# Patient Record
Sex: Male | Born: 1960 | Race: Black or African American | Hispanic: No | Marital: Married | Smoking: Never smoker
Health system: Southern US, Community
[De-identification: ages and names within clinical notes are randomized; demographics above are authoritative.]

## PROBLEM LIST (undated history)

## (undated) DIAGNOSIS — N401 Enlarged prostate with lower urinary tract symptoms: Secondary | ICD-10-CM

## (undated) DIAGNOSIS — N201 Calculus of ureter: Secondary | ICD-10-CM

---

## 2019-12-03 DIAGNOSIS — Z8616 Personal history of COVID-19: Secondary | ICD-10-CM

## 2019-12-03 HISTORY — DX: Personal history of COVID-19: Z86.16

## 2020-03-23 DIAGNOSIS — Z8616 Personal history of COVID-19: Secondary | ICD-10-CM

## 2020-04-04 DIAGNOSIS — R7303 Prediabetes: Secondary | ICD-10-CM

## 2020-04-04 HISTORY — DX: Prediabetes: R73.03

## 2020-04-30 ENCOUNTER — Encounter (HOSPITAL_COMMUNITY): Payer: Self-pay

## 2020-04-30 ENCOUNTER — Ambulatory Visit (HOSPITAL_COMMUNITY)
Admission: EM | Admit: 2020-04-30 | Discharge: 2020-04-30 | Disposition: A | Discharge status: Home / Self Care | Payer: 59 | Source: Home / Self Care

## 2020-04-30 ENCOUNTER — Other Ambulatory Visit: Payer: Self-pay

## 2020-04-30 DIAGNOSIS — R339 Retention of urine, unspecified: Secondary | ICD-10-CM

## 2020-04-30 DIAGNOSIS — N3001 Acute cystitis with hematuria: Secondary | ICD-10-CM

## 2020-04-30 DIAGNOSIS — N200 Calculus of kidney: Secondary | ICD-10-CM | POA: Diagnosis not present

## 2020-04-30 DIAGNOSIS — A4151 Sepsis due to Escherichia coli [E. coli]: Secondary | ICD-10-CM | POA: Diagnosis not present

## 2020-04-30 LAB — POCT URINALYSIS DIPSTICK, ED / UC
Bilirubin Urine: NEGATIVE
Bilirubin Urine: NEGATIVE
Glucose, UA: 100 mg/dL — AB
Glucose, UA: NEGATIVE mg/dL
Ketones, ur: NEGATIVE mg/dL
Ketones, ur: NEGATIVE mg/dL
Nitrite: NEGATIVE
Nitrite: NEGATIVE
Protein, ur: 100 mg/dL — AB
Protein, ur: 100 mg/dL — AB
Specific Gravity, Urine: 1.025 (ref 1.005–1.030)
Specific Gravity, Urine: 1.025 (ref 1.005–1.030)
Urobilinogen, UA: 0.2 mg/dL (ref 0.0–1.0)
Urobilinogen, UA: 0.2 mg/dL (ref 0.0–1.0)
pH: 5.5 (ref 5.0–8.0)
pH: 5.5 (ref 5.0–8.0)

## 2020-04-30 MED ORDER — SULFAMETHOXAZOLE-TRIMETHOPRIM 800-160 MG PO TABS: 1.0000 | ORAL_TABLET | Freq: Two times a day (BID) | ORAL | 0 refills | Status: DC

## 2020-04-30 NOTE — ED Provider Notes (Signed)
[facility]    CSN: [id number] Arrival date & time: 04/30/20  1207      History   Chief Complaint Chief Complaint  Patient presents with  . Urinary Retention    HPI Brandon Carpenter is a 60 y.o. male presenting with urinary frequency for 1-2 months.  States he has had suprapubic pressure  for 2 months, relieved with urination.  Also endorses frequency, urgency;.  Lower back pain and chills for 2 days.  States he is waking up during the night to urinate every 3 hours. Denies hematuria, dysuria,  N/v/d, abdnormal discharge.    HPI  History reviewed. No pertinent past medical history.  There are no problems to display for this patient.   History reviewed. No pertinent surgical history.     Home Medications    Prior to Admission medications   Medication Sig Start Date End Date Taking? Authorizing Provider  Misc Natural Products (PROSTATE HEALTH PO) Take by mouth.   Yes [provider]  sulfamethoxazole-trimethoprim (BACTRIM DS) 800-160 MG tablet Take 1 tablet by mouth 2 (two) times daily for 7 days. 04/30/20 05/07/20 Yes Hazel Sams, PA-C    Family History No family history on file.  Social History Social History   Tobacco Use  . Smoking status: Never Smoker  . Smokeless tobacco: Never Used  Substance Use Topics  . Alcohol use: Yes  . Drug use: Never     Allergies   Penicillins   Review of Systems Review of Systems  Constitutional: Negative for appetite change, chills, diaphoresis and fever.  Respiratory: Negative for shortness of breath.   Cardiovascular: Negative for chest pain.  Gastrointestinal: Positive for abdominal pain. Negative for blood in stool, constipation, diarrhea, nausea and vomiting.  Genitourinary: Positive for flank pain, frequency and urgency. Negative for decreased urine volume, difficulty urinating, dysuria, genital sores, hematuria, penile discharge, penile pain, penile swelling, scrotal swelling and testicular  pain.  Musculoskeletal: Negative for back pain.  Neurological: Negative for dizziness, weakness and light-headedness.  All other systems reviewed and are negative.    Physical Exam Triage Vital Signs ED Triage Vitals  Enc Vitals Group     BP 04/30/20 1308 (!) 144/77     Pulse Rate 04/30/20 1308 100     Resp 04/30/20 1308 18     Temp 04/30/20 1308 97.7 F (36.5 C)     Temp Source 04/30/20 1308 Oral     SpO2 04/30/20 1308 98 %     Weight --      Height --      Head Circumference --      Peak Flow --      Pain Score 04/30/20 1305 6     Pain Loc --      Pain Edu? --      Excl. in [place]? --    No data found.  Updated Vital Signs BP (!) 144/77 (BP Location: Left Arm)   Pulse 100   Temp 97.7 F (36.5 C) (Oral)   Resp 18   SpO2 98%   Visual Acuity Right Eye Distance:   Left Eye Distance:   Bilateral Distance:    Right Eye Near:   Left Eye Near:    Bilateral Near:     Physical Exam Vitals reviewed.  Constitutional:      General: He is not in acute distress.    Appearance: Normal appearance. He is not ill-appearing.  HENT:     Head: Normocephalic and atraumatic.  Cardiovascular:  Rate and Rhythm: Normal rate and regular rhythm.     Heart sounds: Normal heart sounds.  Pulmonary:     Effort: Pulmonary effort is normal.     Breath sounds: Normal breath sounds. No wheezing, rhonchi or rales.  Abdominal:     General: Bowel sounds are normal. There is no distension.     Palpations: Abdomen is soft. There is no mass.     Tenderness: There is abdominal tenderness in the suprapubic area. There is no right CVA tenderness, left CVA tenderness, guarding or rebound. Negative signs include Murphy's sign, Rovsing's sign and McBurney's sign.  Neurological:     General: No focal deficit present.     Mental Status: He is alert and oriented to person, place, and time.  Psychiatric:        Mood and Affect: Mood normal.        Behavior: Behavior normal.        Thought Content:  Thought content normal.        Judgment: Judgment normal.      UC Treatments / Results  Labs (all labs ordered are listed, but only abnormal results are displayed) Labs Reviewed  POCT URINALYSIS DIPSTICK, ED / UC - Abnormal; Notable for the following components:      Result Value   Glucose, UA 100 (*)    Hgb urine dipstick MODERATE (*)    Protein, ur 100 (*)    Leukocytes,Ua SMALL (*)    All other components within normal limits  POCT URINALYSIS DIPSTICK, ED / UC - Abnormal; Notable for the following components:   Hgb urine dipstick MODERATE (*)    Protein, ur 100 (*)    Leukocytes,Ua SMALL (*)    All other components within normal limits  URINE CULTURE    EKG   Radiology No results found.  Procedures Procedures (including critical care time)  Medications Ordered in UC Medications - No data to display  Initial Impression / Assessment and Plan / UC Course  I have reviewed the triage vital signs and the nursing notes.  Pertinent labs & imaging results that were available during my care of the patient were reviewed by me and considered in my medical decision making (see chart for details).    This patient is a 60 year old male presenting with acute cystitis with hematuria.  UA with moderate blood, small leuk. Culture sent.  Pen allergic.  Will treat for UTI with Bactrim as below. He does not have a PCP so referral placed.  Discussed if he continues to have symptoms despite treatment, his prostate could also be playing a role.  Rec follow-up with PCP for this. Return precautions discussed.  Spent over 30 minutes obtaining H&P, performing physical, discussing results, treatment plan and plan for follow-up with patient. Patient agrees with plan.   Final Clinical Impressions(s) / UC Diagnoses   Final diagnoses:  Acute cystitis with hematuria     Discharge Instructions     -Start the antibiotic- bactrim, twice daily for 7 days.  -Drink plenty of water.   -Follow-up if your symptoms worsen or persist, like worsening of back pain, fevers/chills, abdominal pain   ED Prescriptions    Medication Sig Dispense Auth. Provider   sulfamethoxazole-trimethoprim (BACTRIM DS) 800-160 MG tablet Take 1 tablet by mouth 2 (two) times daily for 7 days. 14 tablet Hazel Sams, PA-C     PDMP not reviewed this encounter.   Hazel Sams, PA-C 04/30/20 1352

## 2020-04-30 NOTE — Discharge Instructions (Addendum)
-  Start the antibiotic- bactrim, twice daily for 7 days.  -Drink plenty of water.  -Follow-up if your symptoms worsen or persist, like worsening of back pain, fevers/chills, abdominal pain

## 2020-04-30 NOTE — ED Triage Notes (Signed)
Pt reports lower abdominal pain when he needs to urinates x 2 month. States having difficult when trying urinating. Pt is concern as he needs to wake up to urinate every 3 hrs.  Reports chills and lower back pain x 2 days.

## 2020-05-01 ENCOUNTER — Other Ambulatory Visit: Payer: Self-pay

## 2020-05-01 ENCOUNTER — Inpatient Hospital Stay (HOSPITAL_COMMUNITY): Payer: 59 | Admitting: Certified Registered"

## 2020-05-01 ENCOUNTER — Encounter (HOSPITAL_COMMUNITY): Payer: Self-pay

## 2020-05-01 ENCOUNTER — Emergency Department (HOSPITAL_COMMUNITY): Payer: 59

## 2020-05-01 ENCOUNTER — Inpatient Hospital Stay (HOSPITAL_COMMUNITY): Payer: 59

## 2020-05-01 ENCOUNTER — Encounter (HOSPITAL_COMMUNITY)
Admission: EM | Disposition: A | Discharge status: Home / Self Care | Payer: Self-pay | Source: Home / Self Care | Attending: Family Medicine

## 2020-05-01 ENCOUNTER — Inpatient Hospital Stay (HOSPITAL_COMMUNITY)
Admission: EM | Admit: 2020-05-01 | Discharge: 2020-05-03 | DRG: 854 | Disposition: A | Discharge status: Home / Self Care | Payer: 59 | Attending: Family Medicine | Admitting: Family Medicine

## 2020-05-01 DIAGNOSIS — A4151 Sepsis due to Escherichia coli [E. coli]: Secondary | ICD-10-CM | POA: Diagnosis present

## 2020-05-01 DIAGNOSIS — R339 Retention of urine, unspecified: Secondary | ICD-10-CM | POA: Diagnosis present

## 2020-05-01 DIAGNOSIS — Z20822 Contact with and (suspected) exposure to covid-19: Secondary | ICD-10-CM | POA: Diagnosis present

## 2020-05-01 DIAGNOSIS — N136 Pyonephrosis: Secondary | ICD-10-CM | POA: Diagnosis present

## 2020-05-01 DIAGNOSIS — I248 Other forms of acute ischemic heart disease: Secondary | ICD-10-CM | POA: Diagnosis present

## 2020-05-01 DIAGNOSIS — E871 Hypo-osmolality and hyponatremia: Secondary | ICD-10-CM | POA: Diagnosis present

## 2020-05-01 DIAGNOSIS — R0682 Tachypnea, not elsewhere classified: Secondary | ICD-10-CM | POA: Diagnosis not present

## 2020-05-01 DIAGNOSIS — D6959 Other secondary thrombocytopenia: Secondary | ICD-10-CM | POA: Diagnosis present

## 2020-05-01 DIAGNOSIS — H6123 Impacted cerumen, bilateral: Secondary | ICD-10-CM | POA: Diagnosis present

## 2020-05-01 DIAGNOSIS — N138 Other obstructive and reflux uropathy: Secondary | ICD-10-CM | POA: Diagnosis present

## 2020-05-01 DIAGNOSIS — N401 Enlarged prostate with lower urinary tract symptoms: Secondary | ICD-10-CM | POA: Diagnosis present

## 2020-05-01 DIAGNOSIS — Z9889 Other specified postprocedural states: Secondary | ICD-10-CM

## 2020-05-01 DIAGNOSIS — N179 Acute kidney failure, unspecified: Secondary | ICD-10-CM | POA: Diagnosis present

## 2020-05-01 DIAGNOSIS — E8809 Other disorders of plasma-protein metabolism, not elsewhere classified: Secondary | ICD-10-CM | POA: Diagnosis present

## 2020-05-01 DIAGNOSIS — Z8616 Personal history of COVID-19: Secondary | ICD-10-CM | POA: Diagnosis not present

## 2020-05-01 DIAGNOSIS — A419 Sepsis, unspecified organism: Secondary | ICD-10-CM

## 2020-05-01 DIAGNOSIS — R6520 Severe sepsis without septic shock: Secondary | ICD-10-CM | POA: Diagnosis present

## 2020-05-01 DIAGNOSIS — R7303 Prediabetes: Secondary | ICD-10-CM | POA: Diagnosis present

## 2020-05-01 DIAGNOSIS — R338 Other retention of urine: Secondary | ICD-10-CM | POA: Diagnosis present

## 2020-05-01 DIAGNOSIS — R739 Hyperglycemia, unspecified: Secondary | ICD-10-CM | POA: Diagnosis present

## 2020-05-01 DIAGNOSIS — N200 Calculus of kidney: Secondary | ICD-10-CM | POA: Diagnosis present

## 2020-05-01 DIAGNOSIS — I361 Nonrheumatic tricuspid (valve) insufficiency: Secondary | ICD-10-CM | POA: Diagnosis not present

## 2020-05-01 DIAGNOSIS — N390 Urinary tract infection, site not specified: Secondary | ICD-10-CM

## 2020-05-01 DIAGNOSIS — R7989 Other specified abnormal findings of blood chemistry: Secondary | ICD-10-CM | POA: Diagnosis not present

## 2020-05-01 DIAGNOSIS — Z8619 Personal history of other infectious and parasitic diseases: Secondary | ICD-10-CM

## 2020-05-01 DIAGNOSIS — N132 Hydronephrosis with renal and ureteral calculous obstruction: Secondary | ICD-10-CM | POA: Insufficient documentation

## 2020-05-01 HISTORY — PX: CYSTOSCOPY WITH URETEROSCOPY AND STENT PLACEMENT: SHX6377

## 2020-05-01 HISTORY — DX: Personal history of other infectious and parasitic diseases: Z86.19

## 2020-05-01 HISTORY — DX: Urinary tract infection, site not specified: N39.0

## 2020-05-01 LAB — COMPREHENSIVE METABOLIC PANEL
ALT: 18 U/L (ref 0–44)
AST: 57 U/L — ABNORMAL HIGH (ref 15–41)
Albumin: 3.1 g/dL — ABNORMAL LOW (ref 3.5–5.0)
Alkaline Phosphatase: 79 U/L (ref 38–126)
Anion gap: 11 (ref 5–15)
BUN: 16 mg/dL (ref 6–20)
CO2: 20 mmol/L — ABNORMAL LOW (ref 22–32)
Calcium: 8.2 mg/dL — ABNORMAL LOW (ref 8.9–10.3)
Chloride: 101 mmol/L (ref 98–111)
Creatinine, Ser: 1.42 mg/dL — ABNORMAL HIGH (ref 0.61–1.24)
GFR, Estimated: 58 mL/min — ABNORMAL LOW (ref >60)
Glucose, Bld: 198 mg/dL — ABNORMAL HIGH (ref 70–99)
Potassium: 3.8 mmol/L (ref 3.5–5.1)
Sodium: 132 mmol/L — ABNORMAL LOW (ref 135–145)
Total Bilirubin: 1.3 mg/dL — ABNORMAL HIGH (ref 0.3–1.2)
Total Protein: 6.3 g/dL — ABNORMAL LOW (ref 6.5–8.1)

## 2020-05-01 LAB — CBC WITH DIFFERENTIAL/PLATELET
Abs Immature Granulocytes: 0.09 10*3/uL — ABNORMAL HIGH (ref 0.00–0.07)
Basophils Absolute: 0 10*3/uL (ref 0.0–0.1)
Basophils Relative: 0 %
Eosinophils Absolute: 0 10*3/uL (ref 0.0–0.5)
Eosinophils Relative: 0 %
HCT: 41.4 % (ref 39.0–52.0)
Hemoglobin: 13.6 g/dL (ref 13.0–17.0)
Immature Granulocytes: 1 %
Lymphocytes Relative: 12 %
Lymphs Abs: 1.2 10*3/uL (ref 0.7–4.0)
MCH: 30 pg (ref 26.0–34.0)
MCHC: 32.9 g/dL (ref 30.0–36.0)
MCV: 91.2 fL (ref 80.0–100.0)
Monocytes Absolute: 0.5 10*3/uL (ref 0.1–1.0)
Monocytes Relative: 5 %
Neutro Abs: 8.5 10*3/uL — ABNORMAL HIGH (ref 1.7–7.7)
Neutrophils Relative %: 82 %
Platelets: 143 10*3/uL — ABNORMAL LOW (ref 150–400)
RBC: 4.54 MIL/uL (ref 4.22–5.81)
RDW: 12.9 % (ref 11.5–15.5)
WBC: 10.4 10*3/uL (ref 4.0–10.5)
nRBC: 0 % (ref 0.0–0.2)

## 2020-05-01 LAB — TROPONIN I (HIGH SENSITIVITY)
Troponin I (High Sensitivity): 68 ng/L — ABNORMAL HIGH (ref <18)
Troponin I (High Sensitivity): 373 ng/L (ref <18)
Troponin I (High Sensitivity): 397 ng/L (ref <18)
Troponin I (High Sensitivity): 365 ng/L (ref <18)

## 2020-05-01 LAB — URINALYSIS, ROUTINE W REFLEX MICROSCOPIC
Bilirubin Urine: NEGATIVE
Glucose, UA: 50 mg/dL — AB
Ketones, ur: NEGATIVE mg/dL
Nitrite: NEGATIVE
Protein, ur: 30 mg/dL — AB
Specific Gravity, Urine: 1.015 (ref 1.005–1.030)
WBC, UA: >50 WBC/hpf — ABNORMAL HIGH (ref 0–5)
pH: 5 (ref 5.0–8.0)

## 2020-05-01 LAB — LACTIC ACID, PLASMA
Lactic Acid, Venous: 2.6 mmol/L (ref 0.5–1.9)
Lactic Acid, Venous: 1 mmol/L (ref 0.5–1.9)

## 2020-05-01 LAB — HIV ANTIBODY (ROUTINE TESTING W REFLEX): HIV Screen 4th Generation wRfx: NONREACTIVE

## 2020-05-01 LAB — RESP PANEL BY RT-PCR (FLU A&B, COVID) ARPGX2
Influenza A by PCR: NEGATIVE
Influenza B by PCR: NEGATIVE
SARS Coronavirus 2 by RT PCR: NEGATIVE

## 2020-05-01 LAB — MAGNESIUM: Magnesium: 1.8 mg/dL (ref 1.7–2.4)

## 2020-05-01 SURGERY — CYSTOURETEROSCOPY, WITH STENT INSERTION
Anesthesia: General | Site: Urethra | Laterality: Bilateral

## 2020-05-01 MED ORDER — LIDOCAINE HCL URETHRAL/MUCOSAL 2 % EX GEL
CUTANEOUS | Status: AC
  Filled 2020-05-01: qty 11

## 2020-05-01 MED ORDER — LIDOCAINE 2% (20 MG/ML) 5 ML SYRINGE
INTRAMUSCULAR | Status: DC | PRN
  Administered 2020-05-01: 60 mg via INTRAVENOUS

## 2020-05-01 MED ORDER — LIDOCAINE 2% (20 MG/ML) 5 ML SYRINGE
INTRAMUSCULAR | Status: AC
  Filled 2020-05-01: qty 5

## 2020-05-01 MED ORDER — DEXAMETHASONE SODIUM PHOSPHATE 10 MG/ML IJ SOLN
INTRAMUSCULAR | Status: DC | PRN
  Administered 2020-05-01: 4 mg via INTRAVENOUS

## 2020-05-01 MED ORDER — SODIUM CHLORIDE 0.9% FLUSH
3.0000 mL | Freq: Two times a day (BID) | INTRAVENOUS | Status: DC
  Administered 2020-05-01 – 2020-05-03 (×4): 3 mL via INTRAVENOUS

## 2020-05-01 MED ORDER — FENTANYL CITRATE (PF) 100 MCG/2ML IJ SOLN: 25.0000 ug | INTRAMUSCULAR | Status: DC | PRN

## 2020-05-01 MED ORDER — LACTATED RINGERS IV SOLN: INTRAVENOUS | Status: DC

## 2020-05-01 MED ORDER — ONDANSETRON HCL 4 MG/2ML IJ SOLN
INTRAMUSCULAR | Status: DC | PRN
Start: 2020-05-01 — End: 2020-05-01
  Administered 2020-05-01: 4 mg via INTRAVENOUS

## 2020-05-01 MED ORDER — IOHEXOL 350 MG/ML SOLN
98.0000 mL | Freq: Once | INTRAVENOUS | Status: AC | PRN
  Administered 2020-05-01: 98 mL via INTRAVENOUS

## 2020-05-01 MED ORDER — PHENYLEPHRINE 40 MCG/ML (10ML) SYRINGE FOR IV PUSH (FOR BLOOD PRESSURE SUPPORT)
PREFILLED_SYRINGE | INTRAVENOUS | Status: DC | PRN
  Administered 2020-05-01: 80 ug via INTRAVENOUS
  Administered 2020-05-01: 120 ug via INTRAVENOUS

## 2020-05-01 MED ORDER — PHENYLEPHRINE 40 MCG/ML (10ML) SYRINGE FOR IV PUSH (FOR BLOOD PRESSURE SUPPORT)
PREFILLED_SYRINGE | INTRAVENOUS | Status: AC
  Filled 2020-05-01: qty 10

## 2020-05-01 MED ORDER — SODIUM CHLORIDE 0.9 % IV SOLN
1.0000 g | Freq: Three times a day (TID) | INTRAVENOUS | Status: DC
  Administered 2020-05-01 – 2020-05-02 (×2): 1 g via INTRAVENOUS
  Filled 2020-05-01 (×4): qty 1

## 2020-05-01 MED ORDER — CHLORHEXIDINE GLUCONATE 0.12 % MT SOLN
15.0000 mL | Freq: Once | OROMUCOSAL | Status: AC
  Administered 2020-05-01: 15 mL via OROMUCOSAL

## 2020-05-01 MED ORDER — PROPOFOL 10 MG/ML IV BOLUS
INTRAVENOUS | Status: DC | PRN
  Administered 2020-05-01: 160 mg via INTRAVENOUS

## 2020-05-01 MED ORDER — POLYETHYLENE GLYCOL 3350 17 G PO PACK
17.0000 g | PACK | Freq: Every day | ORAL | Status: DC
  Administered 2020-05-02 – 2020-05-03 (×2): 17 g via ORAL
  Filled 2020-05-01 (×2): qty 1

## 2020-05-01 MED ORDER — WATER FOR IRRIGATION, STERILE IR SOLN
Status: DC | PRN
  Administered 2020-05-01: 3000 mL

## 2020-05-01 MED ORDER — ONDANSETRON HCL 4 MG/2ML IJ SOLN
INTRAMUSCULAR | Status: AC
  Filled 2020-05-01: qty 2

## 2020-05-01 MED ORDER — FENTANYL CITRATE (PF) 250 MCG/5ML IJ SOLN
INTRAMUSCULAR | Status: AC
  Filled 2020-05-01: qty 5

## 2020-05-01 MED ORDER — ROCURONIUM BROMIDE 10 MG/ML (PF) SYRINGE
PREFILLED_SYRINGE | INTRAVENOUS | Status: AC
  Filled 2020-05-01: qty 10

## 2020-05-01 MED ORDER — CHLORHEXIDINE GLUCONATE CLOTH 2 % EX PADS
6.0000 | MEDICATED_PAD | Freq: Every day | CUTANEOUS | Status: DC
  Administered 2020-05-02 – 2020-05-03 (×2): 6 via TOPICAL

## 2020-05-01 MED ORDER — SODIUM CHLORIDE 0.9 % IV BOLUS
1000.0000 mL | Freq: Once | INTRAVENOUS | Status: AC
  Administered 2020-05-01: 1000 mL via INTRAVENOUS

## 2020-05-01 MED ORDER — ACETAMINOPHEN 325 MG PO TABS
650.0000 mg | ORAL_TABLET | Freq: Once | ORAL | Status: AC
  Administered 2020-05-01: 650 mg via ORAL
  Filled 2020-05-01: qty 2

## 2020-05-01 MED ORDER — MIDAZOLAM HCL 2 MG/2ML IJ SOLN
INTRAMUSCULAR | Status: DC | PRN
  Administered 2020-05-01: 2 mg via INTRAVENOUS

## 2020-05-01 MED ORDER — CIPROFLOXACIN IN D5W 200 MG/100ML IV SOLN
200.0000 mg | Freq: Once | INTRAVENOUS | Status: AC
  Administered 2020-05-01: 200 mg via INTRAVENOUS
  Filled 2020-05-01: qty 100

## 2020-05-01 MED ORDER — DEXAMETHASONE SODIUM PHOSPHATE 10 MG/ML IJ SOLN
INTRAMUSCULAR | Status: AC
  Filled 2020-05-01: qty 1

## 2020-05-01 MED ORDER — SODIUM CHLORIDE 0.9 % IV SOLN: 250.0000 mL | INTRAVENOUS | Status: DC | PRN

## 2020-05-01 MED ORDER — SENNA 8.6 MG PO TABS: 1.0000 | ORAL_TABLET | Freq: Every day | ORAL | Status: DC | PRN

## 2020-05-01 MED ORDER — SODIUM CHLORIDE 0.9% FLUSH: 3.0000 mL | INTRAVENOUS | Status: DC | PRN

## 2020-05-01 MED ORDER — MIDAZOLAM HCL 2 MG/2ML IJ SOLN
INTRAMUSCULAR | Status: AC
  Filled 2020-05-01: qty 2

## 2020-05-01 MED ORDER — PROPOFOL 10 MG/ML IV BOLUS
INTRAVENOUS | Status: AC
  Filled 2020-05-01: qty 20

## 2020-05-01 MED ORDER — FENTANYL CITRATE (PF) 250 MCG/5ML IJ SOLN
INTRAMUSCULAR | Status: DC | PRN
  Administered 2020-05-01 (×2): 50 ug via INTRAVENOUS

## 2020-05-01 SURGICAL SUPPLY — 24 items
BAG URINE DRAIN 2000ML AR STRL (UROLOGICAL SUPPLIES) ×2 IMPLANT
BAG URO CATCHER STRL LF (MISCELLANEOUS) IMPLANT
CATH FOLEY LATEX FREE 16FR (CATHETERS) ×1
CATH FOLEY LF 16FR (CATHETERS) ×1 IMPLANT
CATH URET 5FR 28IN OPEN ENDED (CATHETERS) ×2 IMPLANT
GLOVE SURG SS PI 8.0 STRL IVOR (GLOVE) ×2 IMPLANT
GOWN STRL REUS W/ TWL LRG LVL3 (GOWN DISPOSABLE) ×1 IMPLANT
GOWN STRL REUS W/ TWL XL LVL3 (GOWN DISPOSABLE) ×1 IMPLANT
GOWN STRL REUS W/TWL LRG LVL3 (GOWN DISPOSABLE) ×1
GOWN STRL REUS W/TWL XL LVL3 (GOWN DISPOSABLE) ×1
GUIDEWIRE ANG ZIPWIRE 038X150 (WIRE) IMPLANT
GUIDEWIRE STR DUAL SENSOR (WIRE) ×2 IMPLANT
KIT TURNOVER KIT B (KITS) ×2 IMPLANT
MANIFOLD NEPTUNE II (INSTRUMENTS) ×2 IMPLANT
NS IRRIG 1000ML POUR BTL (IV SOLUTION) ×2 IMPLANT
PACK CYSTO (CUSTOM PROCEDURE TRAY) ×2 IMPLANT
SET IRRIG Y TYPE TUR BLADDER L (SET/KITS/TRAYS/PACK) ×2 IMPLANT
STENT URET 6FRX24 CONTOUR (STENTS) IMPLANT
STENT URET 6FRX26 CONTOUR (STENTS) ×2 IMPLANT
SYPHON OMNI JUG (MISCELLANEOUS) ×2 IMPLANT
TOWEL GREEN STERILE FF (TOWEL DISPOSABLE) ×2 IMPLANT
TRAY FOLEY W/BAG SLVR 14FR LF (SET/KITS/TRAYS/PACK) IMPLANT
TUBE CONNECTING 12X1/4 (SUCTIONS) IMPLANT
WATER STERILE IRR 3000ML UROMA (IV SOLUTION) ×2 IMPLANT

## 2020-05-01 NOTE — ED Notes (Signed)
I verbally notified Brandon Carpenter, [place] of pt's critical troponin and obtained orders.

## 2020-05-01 NOTE — Anesthesia Preprocedure Evaluation (Signed)
Anesthesia Evaluation  Patient identified by MRN, date of birth, ID band Patient awake    Reviewed: Allergy & Precautions, NPO status , Patient's Chart, lab work & pertinent test results  Airway Mallampati: II  TM Distance: >3 FB     Dental   Pulmonary neg pulmonary ROS,    breath sounds clear to auscultation       Cardiovascular negative cardio ROS   Rhythm:Regular Rate:Normal     Neuro/Psych negative neurological ROS     GI/Hepatic negative GI ROS, Neg liver ROS,   Endo/Other    Renal/GU negative Renal ROS   History noted Dr. Nyoka Cowden    Musculoskeletal   Abdominal   Peds  Hematology   Anesthesia Other Findings   Reproductive/Obstetrics                             Anesthesia Physical Anesthesia Plan  ASA: III  Anesthesia Plan: General   Post-op Pain Management:    Induction: Intravenous  PONV Risk Score and Plan: 2 and Ondansetron and Midazolam  Airway Management Planned: Oral ETT  Additional Equipment:   Intra-op Plan:   Post-operative Plan: Extubation in OR  Informed Consent: I have reviewed the patients History and Physical, chart, labs and discussed the procedure including the risks, benefits and alternatives for the proposed anesthesia with the patient or authorized representative who has indicated his/her understanding and acceptance.     Dental advisory given  Plan Discussed with: CRNA and Anesthesiologist  Anesthesia Plan Comments:         Anesthesia Quick Evaluation

## 2020-05-01 NOTE — Hospital Course (Addendum)
Brandon Carpenter is a 60 y.o. male presenting with urinary difficulties, shakiness, fever, cough, and headache. No significant PMH.  Urosepsis 2/2 complicated UTI  AKI In ED, patient febrile to 103.7, tachycardic to 120's and tachypnic to 28. Lactic acid 2.6>1.0 after fluids. UA significant for moderate Hgb, large leuks, 30 protein, 50 glucose. Urine micro demonstrated many bacteria, >50 WBC. Started on 200 mg cipro in ED x1, started on Meropenem on the floor and then transitioned to CTX once culture returned as e.coli, until sensitivities resulted. CT renal significant for b/l hydronephrosis (L>R) with 7 mm left distal ureteral stone. Urology consulted, cystoscopy with b/l RTG's and left ureteral stent placed on 2/28. Creatinine 1.42 on admission, resolved after intervention. Urine culture returned with e. Coli that was pansensitive.  Patient was transitioned to oral Keflex to complete 14 day course. Patient will need ureteroscopy for left distal stone with Urology in 1-2 weeks following d/c.   BPH Patient started on tamsulosin and finasteride. At this time, patient not interested in TURP and will follow up with urology on the outpatient setting.  Elevated Troponin Troponin's initially up-trending and then flattened. [phone]. Thought to be related to demand ischemia. Cardiology consulted by ED and declined intervention. Echo performed prior to OR and showed LV EF of 50-55%. No regional wall abnormalities.   Pre-diabetes Patient hyperglycemic on admission, A1c was 6.2. Patient education was started regarding dietary changes, to be continued in the outpatient setting. Patient is very engaged and would like further education.   Issues for follow-up: FPt discharged with catheter. Follow up with Urology with voiding trial. Consider ultrasound outpatient for hypodense lesion in left lobe of liver PCP follow-up for further pre-diabetic dietary education, A1c was 6.2.

## 2020-05-01 NOTE — Anesthesia Procedure Notes (Signed)
Procedure Name: LMA Insertion Date/Time: 05/01/2020 3:23 PM Performed by: Barrington Ellison, CRNA Pre-anesthesia Checklist: Patient identified, Emergency Drugs available, Suction available and Patient being monitored Patient Re-evaluated:Patient Re-evaluated prior to induction Oxygen Delivery Method: Circle System Utilized Preoxygenation: Pre-oxygenation with 100% oxygen Induction Type: IV induction Ventilation: Mask ventilation without difficulty LMA: LMA inserted LMA Size: 5.0 Number of attempts: 1 Placement Confirmation: positive ETCO2 Tube secured with: Tape Dental Injury: Teeth and Oropharynx as per pre-operative assessment

## 2020-05-01 NOTE — Progress Notes (Signed)
On arrival to Short Stay patient placed on monitor. Patient in sinus rhythm without ectopy rate 80's, SpO2 100% on RA.

## 2020-05-01 NOTE — Consult Note (Addendum)
Subjective: CC: Urinary retention.  Hx: Brandon Carpenter is a 60 yo male who I was asked to see in consultation by Dr. Deno Etienne.  Brandon Carpenter had the onset 2 weeks ago of some difficulty voiding.  He was seen in Urgent care a couple of days ago and was given an antibiotic but wasn't able to get it right away.  He began to have chills and came to the ER where he was found to have febrile and septic and a CT showed urinary retention with bilateral hydro and an 31mm left distal ureteral stone that is contributing to the obstruction.   He has no prior GU history.      ROS:  Review of Systems  Constitutional: Positive for chills and fever.  Respiratory: Positive for shortness of breath.   Gastrointestinal: Positive for abdominal pain.  All other systems reviewed and are negative.   Allergies  Allergen Reactions  . Penicillins Swelling    History reviewed. No pertinent past medical history.  History reviewed. No pertinent surgical history.  Social History   Socioeconomic History  . Marital status: Married    Spouse name: Not on file  . Number of children: Not on file  . Years of education: Not on file  . Highest education level: Not on file  Occupational History  . Not on file  Tobacco Use  . Smoking status: Never Smoker  . Smokeless tobacco: Never Used  Substance and Sexual Activity  . Alcohol use: Yes    Comment: occ  . Drug use: Never  . Sexual activity: Not on file  Other Topics Concern  . Not on file  Social History Narrative  . Not on file   Social Determinants of Health   Financial Resource Strain: Not on file  Food Insecurity: Not on file  Transportation Needs: Not on file  Physical Activity: Not on file  Stress: Not on file  Social Connections: Not on file  Intimate Partner Violence: Not on file    History reviewed. No pertinent family history.  Anti-infectives: Anti-infectives (From admission, onward)   Start     Dose/Rate Route Frequency Ordered Stop    05/01/20 1045  ciprofloxacin (CIPRO) IVPB 200 mg        200 mg 100 mL/hr over 60 Minutes Intravenous  Once 05/01/20 1035 05/01/20 1246      Current Facility-Administered Medications  Medication Dose Route Frequency Provider Last Rate Last Admin  . lactated ringers infusion   Intravenous Continuous Belinda Block, MD 10 mL/hr at 05/01/20 1445 New Bag at 05/01/20 1445     Objective: Vital signs in last 24 hours: BP 106/78   Pulse 73   Temp 98.4 F (36.9 C) (Oral)   Resp 18   Ht 6\' 1"  (1.854 m)   Wt 74.8 kg   SpO2 99%   BMI 21.77 kg/m   Intake/Output from previous day: No intake/output data recorded. Intake/Output this shift: Total I/O In: 1100 [IV Piggyback:1100] Out: 1100 [Urine:1100]   Physical Exam Vitals reviewed.  Constitutional:      Appearance: He is well-developed.  HENT:     Head: Normocephalic and atraumatic.  Cardiovascular:     Rate and Rhythm: Regular rhythm. Tachycardia present.  Pulmonary:     Effort: Pulmonary effort is normal. No respiratory distress.  Abdominal:     Palpations: Abdomen is soft.     Tenderness: There is no abdominal tenderness.  Musculoskeletal:        General: Normal range of motion.  Right lower leg: No edema.     Left lower leg: No edema.  Skin:    General: Skin is warm and dry.  Neurological:     General: No focal deficit present.     Mental Status: He is alert and oriented to person, place, and time.  Psychiatric:        Mood and Affect: Mood normal.     Lab Results:  Results for orders placed or performed during the hospital encounter of 05/01/20 (from the past 24 hour(s))  Comprehensive metabolic panel     Status: Abnormal   Collection Time: 05/01/20  9:09 AM  Result Value Ref Range   Sodium 132 (L) 135 - 145 mmol/L   Potassium 3.8 3.5 - 5.1 mmol/L   Chloride 101 98 - 111 mmol/L   CO2 20 (L) 22 - 32 mmol/L   Glucose, Bld 198 (H) 70 - 99 mg/dL   BUN 16 6 - 20 mg/dL   Creatinine, Ser 1.42 (H) 0.61 - 1.24  mg/dL   Calcium 8.2 (L) 8.9 - 10.3 mg/dL   Total Protein 6.3 (L) 6.5 - 8.1 g/dL   Albumin 3.1 (L) 3.5 - 5.0 g/dL   AST 57 (H) 15 - 41 U/L   ALT 18 0 - 44 U/L   Alkaline Phosphatase 79 38 - 126 U/L   Total Bilirubin 1.3 (H) 0.3 - 1.2 mg/dL   GFR, Estimated 58 (L) >60 mL/min   Anion gap 11 5 - 15  CBC with Differential     Status: Abnormal   Collection Time: 05/01/20  9:09 AM  Result Value Ref Range   WBC 10.4 4.0 - 10.5 K/uL   RBC 4.54 4.22 - 5.81 MIL/uL   Hemoglobin 13.6 13.0 - 17.0 g/dL   HCT 41.4 39.0 - 52.0 %   MCV 91.2 80.0 - 100.0 fL   MCH 30.0 26.0 - 34.0 pg   MCHC 32.9 30.0 - 36.0 g/dL   RDW 12.9 11.5 - 15.5 %   Platelets 143 (L) 150 - 400 K/uL   nRBC 0.0 0.0 - 0.2 %   Neutrophils Relative % 82 %   Neutro Abs 8.5 (H) 1.7 - 7.7 K/uL   Lymphocytes Relative 12 %   Lymphs Abs 1.2 0.7 - 4.0 K/uL   Monocytes Relative 5 %   Monocytes Absolute 0.5 0.1 - 1.0 K/uL   Eosinophils Relative 0 %   Eosinophils Absolute 0.0 0.0 - 0.5 K/uL   Basophils Relative 0 %   Basophils Absolute 0.0 0.0 - 0.1 K/uL   Immature Granulocytes 1 %   Abs Immature Granulocytes 0.09 (H) 0.00 - 0.07 K/uL  Troponin I (High Sensitivity)     Status: Abnormal   Collection Time: 05/01/20  9:09 AM  Result Value Ref Range   Troponin I (High Sensitivity) 68 (H) <18 ng/L  Lactic acid, plasma     Status: Abnormal   Collection Time: 05/01/20  9:09 AM  Result Value Ref Range   Lactic Acid, Venous 2.6 (HH) 0.5 - 1.9 mmol/L  Blood culture (routine x 2)     Status: None (Preliminary result)   Collection Time: 05/01/20  9:09 AM   Specimen: BLOOD RIGHT HAND  Result Value Ref Range   Specimen Description BLOOD RIGHT HAND    Special Requests      BOTTLES DRAWN AEROBIC AND ANAEROBIC Blood Culture adequate volume   Culture      NO GROWTH < 12 HOURS Performed at [facility],  [address], [place], [place] [address]    Report Status PENDING   Blood culture (routine x 2)     Status: None (Preliminary result)    Collection Time: 05/01/20  9:09 AM   Specimen: BLOOD LEFT HAND  Result Value Ref Range   Specimen Description BLOOD LEFT HAND    Special Requests      BOTTLES DRAWN AEROBIC AND ANAEROBIC Blood Culture results may not be optimal due to an inadequate volume of blood received in culture bottles   Culture      NO GROWTH < 12 HOURS Performed at [facility] [address] [place], [place] [address]    Report Status PENDING   Urinalysis, Routine w reflex microscopic Urine, Random     Status: Abnormal   Collection Time: 05/01/20  9:09 AM  Result Value Ref Range   Color, Urine YELLOW YELLOW   APPearance HAZY (A) CLEAR   Specific Gravity, Urine 1.015 1.005 - 1.030   pH 5.0 5.0 - 8.0   Glucose, UA 50 (A) NEGATIVE mg/dL   Hgb urine dipstick MODERATE (A) NEGATIVE   Bilirubin Urine NEGATIVE NEGATIVE   Ketones, ur NEGATIVE NEGATIVE mg/dL   Protein, ur 30 (A) NEGATIVE mg/dL   Nitrite NEGATIVE NEGATIVE   Leukocytes,Ua LARGE (A) NEGATIVE   RBC / HPF 0-5 0 - 5 RBC/hpf   WBC, UA >50 (H) 0 - 5 WBC/hpf   Bacteria, UA MANY (A) NONE SEEN   Squamous Epithelial / LPF 0-5 0 - 5  Resp Panel by RT-PCR (Flu A&B, Covid) Nasopharyngeal Swab     Status: None   Collection Time: 05/01/20  9:09 AM   Specimen: Nasopharyngeal Swab; Nasopharyngeal(NP) swabs in vial transport medium  Result Value Ref Range   SARS Coronavirus 2 by RT PCR NEGATIVE NEGATIVE   Influenza A by PCR NEGATIVE NEGATIVE   Influenza B by PCR NEGATIVE NEGATIVE  Lactic acid, plasma     Status: None   Collection Time: 05/01/20 11:03 AM  Result Value Ref Range   Lactic Acid, Venous 1.0 0.5 - 1.9 mmol/L  Troponin I (High Sensitivity)     Status: Abnormal   Collection Time: 05/01/20 11:03 AM  Result Value Ref Range   Troponin I (High Sensitivity) 373 (HH) <18 ng/L  Troponin I (High Sensitivity)     Status: Abnormal   Collection Time: 05/01/20 12:54 PM  Result Value Ref Range   Troponin I (High Sensitivity) 397 (HH) <18 ng/L     BMET Recent Labs    05/01/20 0909  NA 132*  K 3.8  CL 101  CO2 20*  GLUCOSE 198*  BUN 16  CREATININE 1.42*  CALCIUM 8.2*   PT/INR No results for input(s): LABPROT, INR in the last 72 hours. ABG No results for input(s): PHART, HCO3 in the last 72 hours.  Invalid input(s): PCO2, PO2  Studies/Results: CT Angio Chest PE W and/or Wo Contrast  Result Date: 05/01/2020 CLINICAL DATA:  Shortness of breath and nonproductive cough EXAM: CT ANGIOGRAPHY CHEST WITH CONTRAST TECHNIQUE: Multidetector CT imaging of the chest was performed using the standard protocol during bolus administration of intravenous contrast. Multiplanar CT image reconstructions and MIPs were obtained to evaluate the vascular anatomy. CONTRAST:  71mL OMNIPAQUE IOHEXOL 350 MG/ML SOLN COMPARISON:  Chest x-ray from earlier in the same day. FINDINGS: Cardiovascular: Thoracic aorta and its branches are within normal limits. No significant coronary calcifications are noted. No cardiac enlargement is seen. No pericardial effusion is seen. The  pulmonary artery is well visualized within normal branching pattern bilaterally. No filling defects to suggest pulmonary emboli are identified. Mediastinum/Nodes: Thoracic inlet is within normal limits. No sizable hilar or mediastinal adenopathy is noted. Esophagus as visualized within normal limits. Lungs/Pleura: Minimal right basilar atelectasis is noted. No focal infiltrate or sizable effusion is seen. No sizable parenchymal nodule is noted. Upper Abdomen: Visualized upper abdomen demonstrates a hypodensity within the left lobe of the liver best seen image number 309 of series 7 likely representing a cyst but incompletely evaluated on this exam. It is better visualized on this exam than on the prior precontrast images. Musculoskeletal: Mild degenerative changes of the thoracic spine are noted. Review of the MIP images confirms the above findings. IMPRESSION: No evidence of pulmonary emboli. Mild  right basilar atelectasis. Hypodense lesion in the left lobe of the liver better visualized than on the prior precontrast image likely representing a cyst but incompletely evaluated on this exam. Nonemergent ultrasound could be performed for clarification as clinically necessary. Electronically Signed   By: Inez Catalina M.D.   On: 05/01/2020 11:57   DG Chest Port 1 View  Result Date: 05/01/2020 CLINICAL DATA:  Cough and fevers EXAM: PORTABLE CHEST 1 VIEW COMPARISON:  None. FINDINGS: Cardiac shadow is within normal limits. The lungs are well aerated without focal infiltrate. No bony abnormality is seen. IMPRESSION: No active disease. Electronically Signed   By: Inez Catalina M.D.   On: 05/01/2020 10:40   CT Renal Stone Study  Result Date: 05/01/2020 CLINICAL DATA:  Flank pain EXAM: CT ABDOMEN AND PELVIS WITHOUT CONTRAST TECHNIQUE: Multidetector CT imaging of the abdomen and pelvis was performed following the standard protocol without IV contrast. COMPARISON:  None. FINDINGS: Lower chest: Minimal atelectatic changes are noted in the right base. Hepatobiliary: No focal liver abnormality is seen. No gallstones, gallbladder wall thickening, or biliary dilatation. Pancreas: Unremarkable. No pancreatic ductal dilatation or surrounding inflammatory changes. Spleen: Normal in size without focal abnormality. Adrenals/Urinary Tract: Adrenal glands are within normal limits. Kidneys demonstrate bilateral hydronephrotic changes slightly worse on the left than the right. No renal calculi are seen. There is however a distal left ureteral stone which measures almost 7 mm in greatest dimension. No ureteral stone is noted on the right. The bladder is partially distended. Stomach/Bowel: Mild diverticular changes noted without evidence of diverticulitis. The appendix is within normal limits. No small bowel or gastric abnormality is seen. Vascular/Lymphatic: Left retroaortic renal vein is noted. No enlarged abdominal or pelvic  lymph nodes. Reproductive: Prostate is unremarkable. Other: No abdominal wall hernia or abnormality. No abdominopelvic ascites. Musculoskeletal: No acute or significant osseous findings. IMPRESSION: Bilateral hydronephrosis left greater than right with evidence of a 7 mm distal left ureteral stone. No obstructing ureteral stone on the right is seen. Mild diverticular change without diverticulitis. Electronically Signed   By: Inez Catalina M.D.   On: 05/01/2020 11:52     Assessment/Plan: Sepsis with urinary retention, bilateral hydronephrosis and 73mm left distal ureteral stone.   He had a foley placed with return on 1168ml of urine but he needs cystoscopy with bilateral RTG's and left ureteral stent.   He will need the stone and retention dealt with once the sepsis has resolved.   I reviewed the risks of bleeding, infection, ureteral and urinary tract injury, need for secondary procedures, thrombotic events and anesthetic complications.       No follow-ups on file.    CC: Ezequiel Essex MD.      Jenny Reichmann  Brandon Carpenter 05/01/2020 [phone]

## 2020-05-01 NOTE — H&P (Cosign Needed Addendum)
[facility] Admission History and Physical Service Pager: [phone]  Patient name: Brandon Carpenter Medical record number: [id number] Date of birth: 13-Nov-1960 Age: 60 y.o. Gender: male  Primary Care Provider: Patient, No Pcp Per Consultants: urology Code Status: Full  Preferred Emergency Contact: Wife Elmo Putt - [phone]  Chief Complaint: Difficulty urinating  Assessment and Plan: Trip Cavanagh is a 60 y.o. male presenting with urinary difficulties, shakiness, fever, cough, and headache. No significant PMH.  Sepsis 2/2 complicated UTI  Tachypnea  Tachycardia Presented to ED via EMS for shakiness, fever, cough, and headache. Per EMS, was found hypoxic and tachypneic. In ED, febrile to 103.7 F with tachycardia to 120s and tachypnea to 28 rpm. Normotensive with systolics 601U-932T. SpO2 > 94% on room air. In ED received 1L bolus normal saline, 200 mg ciprofloxacin, and tylenol 650 mg x1. Patient afebrile s/p tylenol. Lactic acid 2.6>1.0 s/p fluids. UA remarkable for moderate Hgb, large leuks, 30 protein, 50 glucose. Urine micro demonstrated many bacteria, >50 WBC. Urine cultures obtains. CXR negative for active disease. Respiratory panel negative for COVID and influenza. CTA chest negative for PE. CT renal demonstrated bilateral hydronephrosis (L>R) with 7 mm left distal ureteral stone. Dr. Jeffie Pollock of urology consulted, would like to take patient for ureteral stent; patient made NPO, foley catheter placed.  - admit to cards-tele with Dr. McDiarmid attending - continuous cardiac telemetry -meropenem per pharmacy  - vitals per unit routine - continuous pulse ox - f/u urine cultures -monitor for fever   Elevated Troponin Found tachypneic, hypoxic by EMS. Troponins trending upward initially then flattened; [phone], likely due to demand ischemia. Dr. Milta Deiters of cardiology consulted by ED PA, declines intervention. Initially recommended ECHO in preparation for  OR with urology.  -pending echo - cardiac monitoring  AKI Creatinine on admission 1.42, likely postrenal due to possible obstruction. Unknown baseline, no other creatinine measurements in our system. Patient denies known history of kidney disease or abnormalities. AKI likely to resolve after urology intervention.  - AM BMPs  Hyponatremia Sodium 132 on admission. S/p 1L normal saline bolus in ED.  - monitor with daily BMPs  Hypoalbuminemia Albumin 3.1 on admission.  - consult nutrition, appreciate recommendations  Elevated liver enzymes On admission, AST elevated to 57. ALT and alk phos normal at 18 and 79, respectively.  -monitor CMP  Hyperglycemia Glucose on admission BMP 198. Likely elevated in the setting of acute illness. No known recent steroids.  - A1c with morning labs  Thrombocytopenia Platelets on admission 143, unsure of baseline as no other values per chart review. Possibly likely reactive but unsure of exact etiology.  -monitor CBC   FEN/GI: regular Prophylaxis: Lovenox post-op  Disposition: Surgery per urology then med tele  History of Present Illness:  Brandon Carpenter is a 60 y.o. male presenting with fever and chills. History obtained from patient himself. Reports that symptoms started a few months ago which included difficulty initiating urine flow. Noticed worsening symptoms 3 days ago (Friday) when he was having increased urinary frequency and bilateral lower abdominal pain, he went to urgent care who gave him a prescription for antibiotics. He was not able to pick it up since it was the weekend so his wife picked up other antibiotics for him. 2 days ago, he experienced a fever of 104 F temperature and chills. Denies nausea, vomiting, hematuria, irregular BM and bloody stool. Denies recent illness or exposure to sick contacts. Called EMS and brought to the hospital. In the ED, scan demonstrated  bilateral hydronephrosis. Reports that he hasn't ate much over the last  few days due to his symptoms. He has never experienced anything similar to this previously, no known prior kidney stone to his knowledge. Reports getting both doses of the COVID vaccine along with the booster. Denies tobacco use and illicit drug use. Endorses being a social drinker.   Of note, patient reported that he had a previous anaphylaxis reaction to penicillin including swelling of his face and difficulty breathing. He has never had Keflex before. Will avoid penicillin-related antibiotics.   Review Of Systems: Per HPI with the following additions:   Review of Systems  Constitutional: Positive for appetite change, chills and fatigue.  Eyes: Negative for visual disturbance.  Respiratory: Positive for shortness of breath (yesterday during his chills).   Cardiovascular: Negative for chest pain.  Gastrointestinal: Positive for abdominal distention. Negative for abdominal pain, blood in stool, nausea and vomiting.  Genitourinary: Positive for difficulty urinating and dysuria.  Neurological: Negative for dizziness and weakness.  Psychiatric/Behavioral: Negative for confusion.     Patient Active Problem List   Diagnosis Date Noted  . Sepsis ([place]) 05/01/2020    Past Medical History: Past Medical History:  Diagnosis Date  . UTI (urinary tract infection) 05/01/2020    Past Surgical History: Past Surgical History:  Procedure Laterality Date  . NO PAST SURGERIES      Social History: Social History   Tobacco Use  . Smoking status: Never Smoker  . Smokeless tobacco: Never Used  Vaping Use  . Vaping Use: Never used  Substance Use Topics  . Alcohol use: Yes    Comment: occ  . Drug use: Never   Additional social history:  Please also refer to relevant sections of EMR.  Family History: History reviewed. No pertinent family history.   Allergies and Medications: Allergies  Allergen Reactions  . Penicillins Anaphylaxis and Swelling    "Face and throat swell"   No current  facility-administered medications on file prior to encounter.   Current Outpatient Medications on File Prior to Encounter  Medication Sig Dispense Refill  . AZO URINARY PAIN RELIEF 95 MG tablet Take 95 mg by mouth 3 (three) times daily as needed for pain.    Marland Kitchen ibuprofen (ADVIL) 200 MG tablet Take 400 mg by mouth every 6 (six) hours as needed (for pain).    Marland Kitchen sulfamethoxazole-trimethoprim (BACTRIM DS) 800-160 MG tablet Take 1 tablet by mouth 2 (two) times daily for 7 days. 14 tablet 0    Objective: BP 108/73 (BP Location: Left Arm)   Pulse 81   Temp 98.4 F (36.9 C) (Oral)   Resp 20   Ht 6' 1"  (1.854 m)   Wt 74.8 kg   SpO2 100%   BMI 21.77 kg/m   Exam: General: Patient sitting upright in bed comfortably, in no acute distress.  Neck: supple, without evidence of lymphadenopathy  Cardiovascular: RRR, no murmurs or gallops auscultated  Respiratory: CTAB, normal WOB on room air  Gastrointestinal: soft, nontender, abdominal distention noted, presence of active bowel sounds MSK: Normal muscle bulk/tone, no CVA tenderness noted  Derm: skin warm and dry to touch, no rashes or lesions noted  Neuro: A&Ox4, no focal deficits, appropriately conversational  Psych: mood appropriate, behavior and speech appropriate to situation   Labs and Imaging: CBC BMET  Recent Labs  Lab 05/01/20 0909  WBC 10.4  HGB 13.6  HCT 41.4  PLT 143*   Recent Labs  Lab 05/01/20 0909  NA 132*  K  3.8  CL 101  CO2 20*  BUN 16  CREATININE 1.42*  GLUCOSE 198*  CALCIUM 8.2*     EKG: Sinus tach at 118 bpm, QTc 418, no ST segment elevation   PORTABLE CHEST 1 VIEW 05/01/2020 COMPARISON:  None. FINDINGS: Cardiac shadow is within normal limits. The lungs are well aerated without focal infiltrate. No bony abnormality is seen. IMPRESSION: No active disease.  CT ABDOMEN AND PELVIS WITHOUT CONTRAST 05/01/2020 TECHNIQUE: Multidetector CT imaging of the abdomen and pelvis was performed following the standard  protocol without IV contrast. COMPARISON:  None. FINDINGS: Lower chest: Minimal atelectatic changes are noted in the right base. Hepatobiliary: No focal liver abnormality is seen. No gallstones, gallbladder wall thickening, or biliary dilatation. Pancreas: Unremarkable. No pancreatic ductal dilatation or surrounding inflammatory changes. Spleen: Normal in size without focal abnormality. Adrenals/Urinary Tract: Adrenal glands are within normal limits. Kidneys demonstrate bilateral hydronephrotic changes slightly worse on the left than the right. No renal calculi are seen. There is however a distal left ureteral stone which measures almost 7 mm in greatest dimension. No ureteral stone is noted on the right. The bladder is partially distended. Stomach/Bowel: Mild diverticular changes noted without evidence of diverticulitis. The appendix is within normal limits. No small bowel or gastric abnormality is seen. Vascular/Lymphatic: Left retroaortic renal vein is noted. No enlarged abdominal or pelvic lymph nodes. Reproductive: Prostate is unremarkable. Other: No abdominal wall hernia or abnormality. No abdominopelvic ascites. Musculoskeletal: No acute or significant osseous findings. IMPRESSION: Bilateral hydronephrosis left greater than right with evidence of a 7 mm distal left ureteral stone. No obstructing ureteral stone on the right is seen. Mild diverticular change without diverticulitis.  CT ANGIOGRAPHY CHEST WITH CONTRAST 05/01/2020 TECHNIQUE: Multidetector CT imaging of the chest was performed using the standard protocol during bolus administration of intravenous contrast. Multiplanar CT image reconstructions and MIPs were obtained to evaluate the vascular anatomy. CONTRAST:  28m OMNIPAQUE IOHEXOL 350 MG/ML SOLN COMPARISON:  Chest x-ray from earlier in the same day. FINDINGS: Cardiovascular: Thoracic aorta and its branches are within normal limits. No significant coronary  calcifications are noted. No cardiac enlargement is seen. No pericardial effusion is seen. The pulmonary artery is well visualized within normal branching pattern bilaterally. No filling defects to suggest pulmonary emboli are identified. Mediastinum/Nodes: Thoracic inlet is within normal limits. No sizable hilar or mediastinal adenopathy is noted. Esophagus as visualized within normal limits. Lungs/Pleura: Minimal right basilar atelectasis is noted. No focal infiltrate or sizable effusion is seen. No sizable parenchymal nodule is noted. Upper Abdomen: Visualized upper abdomen demonstrates a hypodensity within the left lobe of the liver best seen image number 309 of series 7 likely representing a cyst but incompletely evaluated on this exam. It is better visualized on this exam than on the prior precontrast images. Musculoskeletal: Mild degenerative changes of the thoracic spine are noted. Review of the MIP images confirms the above findings. IMPRESSION: No evidence of pulmonary emboli. Mild right basilar atelectasis. Hypodense lesion in the left lobe of the liver better visualized than on the prior precontrast image likely representing a cyst but incompletely evaluated on this exam. Nonemergent ultrasound could be performed for clarification as clinically necessary.  GDonney Dice DO 05/01/2020, 8:52 PM PGY-1, [facility] pager: [phone] text pages welcome   Upper Level Addendum:  I have seen and evaluated this patient along with Dr. GLarae Groomsand reviewed the above note, making necessary revisions as appropriate.   RLurline Del DO 05/01/2020, 9:45 PM  PGY-2, [place]

## 2020-05-01 NOTE — Transfer of Care (Signed)
Immediate Anesthesia Transfer of Care Note  Patient: Brandon Carpenter  Procedure(s) Performed: URETERAL STENT PLACEMENT (Bilateral Urethra)  Patient Location: PACU  Anesthesia Type:General  Level of Consciousness: lethargic and responds to stimulation  Airway & Oxygen Therapy: Patient Spontanous Breathing  Post-op Assessment: Report given to RN  Post vital signs: Reviewed and stable  Last Vitals:  Vitals Value Taken Time  BP 156/114 05/01/20 1609  Temp    Pulse 117 05/01/20 1609  Resp 16 05/01/20 1609  SpO2 97 % 05/01/20 1609  Vitals shown include unvalidated device data.  Last Pain:  Vitals:   05/01/20 1429  TempSrc: Oral  PainSc:          Complications: No complications documented.

## 2020-05-01 NOTE — H&P (View-Only) (Signed)
Subjective: CC: Urinary retention.  Hx: Brandon Carpenter is a 60 yo male who I was asked to see in consultation by Dr. Deno Etienne.  Brandon Carpenter had the onset 2 weeks ago of some difficulty voiding.  He was seen in Urgent care a couple of days ago and was given an antibiotic but wasn't able to get it right away.  He began to have chills and came to the ER where he was found to have febrile and septic and a CT showed urinary retention with bilateral hydro and an 8mm left distal ureteral stone that is contributing to the obstruction.   He has no prior GU history.      ROS:  Review of Systems  Constitutional: Positive for chills and fever.  Respiratory: Positive for shortness of breath.   Gastrointestinal: Positive for abdominal pain.  All other systems reviewed and are negative.   Allergies  Allergen Reactions  . Penicillins Swelling    History reviewed. No pertinent past medical history.  History reviewed. No pertinent surgical history.  Social History   Socioeconomic History  . Marital status: Married    Spouse name: Not on file  . Number of children: Not on file  . Years of education: Not on file  . Highest education level: Not on file  Occupational History  . Not on file  Tobacco Use  . Smoking status: Never Smoker  . Smokeless tobacco: Never Used  Substance and Sexual Activity  . Alcohol use: Yes    Comment: occ  . Drug use: Never  . Sexual activity: Not on file  Other Topics Concern  . Not on file  Social History Narrative  . Not on file   Social Determinants of Health   Financial Resource Strain: Not on file  Food Insecurity: Not on file  Transportation Needs: Not on file  Physical Activity: Not on file  Stress: Not on file  Social Connections: Not on file  Intimate Partner Violence: Not on file    History reviewed. No pertinent family history.  Anti-infectives: Anti-infectives (From admission, onward)   Start     Dose/Rate Route Frequency Ordered Stop    05/01/20 1045  ciprofloxacin (CIPRO) IVPB 200 mg        200 mg 100 mL/hr over 60 Minutes Intravenous  Once 05/01/20 1035 05/01/20 1246      Current Facility-Administered Medications  Medication Dose Route Frequency Provider Last Rate Last Admin  . lactated ringers infusion   Intravenous Continuous Belinda Block, MD 10 mL/hr at 05/01/20 1445 New Bag at 05/01/20 1445     Objective: Vital signs in last 24 hours: BP 106/78   Pulse 73   Temp 98.4 F (36.9 C) (Oral)   Resp 18   Ht 6\' 1"  (1.854 m)   Wt 74.8 kg   SpO2 99%   BMI 21.77 kg/m   Intake/Output from previous day: No intake/output data recorded. Intake/Output this shift: Total I/O In: 1100 [IV Piggyback:1100] Out: 1100 [Urine:1100]   Physical Exam Vitals reviewed.  Constitutional:      Appearance: He is well-developed.  HENT:     Head: Normocephalic and atraumatic.  Cardiovascular:     Rate and Rhythm: Regular rhythm. Tachycardia present.  Pulmonary:     Effort: Pulmonary effort is normal. No respiratory distress.  Abdominal:     Palpations: Abdomen is soft.     Tenderness: There is no abdominal tenderness.  Musculoskeletal:        General: Normal range of motion.  Right lower leg: No edema.     Left lower leg: No edema.  Skin:    General: Skin is warm and dry.  Neurological:     General: No focal deficit present.     Mental Status: He is alert and oriented to person, place, and time.  Psychiatric:        Mood and Affect: Mood normal.     Lab Results:  Results for orders placed or performed during the hospital encounter of 05/01/20 (from the past 24 hour(s))  Comprehensive metabolic panel     Status: Abnormal   Collection Time: 05/01/20  9:09 AM  Result Value Ref Range   Sodium 132 (L) 135 - 145 mmol/L   Potassium 3.8 3.5 - 5.1 mmol/L   Chloride 101 98 - 111 mmol/L   CO2 20 (L) 22 - 32 mmol/L   Glucose, Bld 198 (H) 70 - 99 mg/dL   BUN 16 6 - 20 mg/dL   Creatinine, Ser 1.42 (H) 0.61 - 1.24  mg/dL   Calcium 8.2 (L) 8.9 - 10.3 mg/dL   Total Protein 6.3 (L) 6.5 - 8.1 g/dL   Albumin 3.1 (L) 3.5 - 5.0 g/dL   AST 57 (H) 15 - 41 U/L   ALT 18 0 - 44 U/L   Alkaline Phosphatase 79 38 - 126 U/L   Total Bilirubin 1.3 (H) 0.3 - 1.2 mg/dL   GFR, Estimated 58 (L) >60 mL/min   Anion gap 11 5 - 15  CBC with Differential     Status: Abnormal   Collection Time: 05/01/20  9:09 AM  Result Value Ref Range   WBC 10.4 4.0 - 10.5 K/uL   RBC 4.54 4.22 - 5.81 MIL/uL   Hemoglobin 13.6 13.0 - 17.0 g/dL   HCT 41.4 39.0 - 52.0 %   MCV 91.2 80.0 - 100.0 fL   MCH 30.0 26.0 - 34.0 pg   MCHC 32.9 30.0 - 36.0 g/dL   RDW 12.9 11.5 - 15.5 %   Platelets 143 (L) 150 - 400 K/uL   nRBC 0.0 0.0 - 0.2 %   Neutrophils Relative % 82 %   Neutro Abs 8.5 (H) 1.7 - 7.7 K/uL   Lymphocytes Relative 12 %   Lymphs Abs 1.2 0.7 - 4.0 K/uL   Monocytes Relative 5 %   Monocytes Absolute 0.5 0.1 - 1.0 K/uL   Eosinophils Relative 0 %   Eosinophils Absolute 0.0 0.0 - 0.5 K/uL   Basophils Relative 0 %   Basophils Absolute 0.0 0.0 - 0.1 K/uL   Immature Granulocytes 1 %   Abs Immature Granulocytes 0.09 (H) 0.00 - 0.07 K/uL  Troponin I (High Sensitivity)     Status: Abnormal   Collection Time: 05/01/20  9:09 AM  Result Value Ref Range   Troponin I (High Sensitivity) 68 (H) <18 ng/L  Lactic acid, plasma     Status: Abnormal   Collection Time: 05/01/20  9:09 AM  Result Value Ref Range   Lactic Acid, Venous 2.6 (HH) 0.5 - 1.9 mmol/L  Blood culture (routine x 2)     Status: None (Preliminary result)   Collection Time: 05/01/20  9:09 AM   Specimen: BLOOD RIGHT HAND  Result Value Ref Range   Specimen Description BLOOD RIGHT HAND    Special Requests      BOTTLES DRAWN AEROBIC AND ANAEROBIC Blood Culture adequate volume   Culture      NO GROWTH < 12 HOURS Performed at [facility],  [address], [place], [place] [address]    Report Status PENDING   Blood culture (routine x 2)     Status: None (Preliminary result)    Collection Time: 05/01/20  9:09 AM   Specimen: BLOOD LEFT HAND  Result Value Ref Range   Specimen Description BLOOD LEFT HAND    Special Requests      BOTTLES DRAWN AEROBIC AND ANAEROBIC Blood Culture results may not be optimal due to an inadequate volume of blood received in culture bottles   Culture      NO GROWTH < 12 HOURS Performed at [facility] [address], [place], [place] [address]    Report Status PENDING   Urinalysis, Routine w reflex microscopic Urine, Random     Status: Abnormal   Collection Time: 05/01/20  9:09 AM  Result Value Ref Range   Color, Urine YELLOW YELLOW   APPearance HAZY (A) CLEAR   Specific Gravity, Urine 1.015 1.005 - 1.030   pH 5.0 5.0 - 8.0   Glucose, UA 50 (A) NEGATIVE mg/dL   Hgb urine dipstick MODERATE (A) NEGATIVE   Bilirubin Urine NEGATIVE NEGATIVE   Ketones, ur NEGATIVE NEGATIVE mg/dL   Protein, ur 30 (A) NEGATIVE mg/dL   Nitrite NEGATIVE NEGATIVE   Leukocytes,Ua LARGE (A) NEGATIVE   RBC / HPF 0-5 0 - 5 RBC/hpf   WBC, UA >50 (H) 0 - 5 WBC/hpf   Bacteria, UA MANY (A) NONE SEEN   Squamous Epithelial / LPF 0-5 0 - 5  Resp Panel by RT-PCR (Flu A&B, Covid) Nasopharyngeal Swab     Status: None   Collection Time: 05/01/20  9:09 AM   Specimen: Nasopharyngeal Swab; Nasopharyngeal(NP) swabs in vial transport medium  Result Value Ref Range   SARS Coronavirus 2 by RT PCR NEGATIVE NEGATIVE   Influenza A by PCR NEGATIVE NEGATIVE   Influenza B by PCR NEGATIVE NEGATIVE  Lactic acid, plasma     Status: None   Collection Time: 05/01/20 11:03 AM  Result Value Ref Range   Lactic Acid, Venous 1.0 0.5 - 1.9 mmol/L  Troponin I (High Sensitivity)     Status: Abnormal   Collection Time: 05/01/20 11:03 AM  Result Value Ref Range   Troponin I (High Sensitivity) 373 (HH) <18 ng/L  Troponin I (High Sensitivity)     Status: Abnormal   Collection Time: 05/01/20 12:54 PM  Result Value Ref Range   Troponin I (High Sensitivity) 397 (HH) <18 ng/L     BMET Recent Labs    05/01/20 0909  NA 132*  K 3.8  CL 101  CO2 20*  GLUCOSE 198*  BUN 16  CREATININE 1.42*  CALCIUM 8.2*   PT/INR No results for input(s): LABPROT, INR in the last 72 hours. ABG No results for input(s): PHART, HCO3 in the last 72 hours.  Invalid input(s): PCO2, PO2  Studies/Results: CT Angio Chest PE W and/or Wo Contrast  Result Date: 05/01/2020 CLINICAL DATA:  Shortness of breath and nonproductive cough EXAM: CT ANGIOGRAPHY CHEST WITH CONTRAST TECHNIQUE: Multidetector CT imaging of the chest was performed using the standard protocol during bolus administration of intravenous contrast. Multiplanar CT image reconstructions and MIPs were obtained to evaluate the vascular anatomy. CONTRAST:  45mL OMNIPAQUE IOHEXOL 350 MG/ML SOLN COMPARISON:  Chest x-ray from earlier in the same day. FINDINGS: Cardiovascular: Thoracic aorta and its branches are within normal limits. No significant coronary calcifications are noted. No cardiac enlargement is seen. No pericardial effusion is seen. The  pulmonary artery is well visualized within normal branching pattern bilaterally. No filling defects to suggest pulmonary emboli are identified. Mediastinum/Nodes: Thoracic inlet is within normal limits. No sizable hilar or mediastinal adenopathy is noted. Esophagus as visualized within normal limits. Lungs/Pleura: Minimal right basilar atelectasis is noted. No focal infiltrate or sizable effusion is seen. No sizable parenchymal nodule is noted. Upper Abdomen: Visualized upper abdomen demonstrates a hypodensity within the left lobe of the liver best seen image number 309 of series 7 likely representing a cyst but incompletely evaluated on this exam. It is better visualized on this exam than on the prior precontrast images. Musculoskeletal: Mild degenerative changes of the thoracic spine are noted. Review of the MIP images confirms the above findings. IMPRESSION: No evidence of pulmonary emboli. Mild  right basilar atelectasis. Hypodense lesion in the left lobe of the liver better visualized than on the prior precontrast image likely representing a cyst but incompletely evaluated on this exam. Nonemergent ultrasound could be performed for clarification as clinically necessary. Electronically Signed   By: Inez Catalina M.D.   On: 05/01/2020 11:57   DG Chest Port 1 View  Result Date: 05/01/2020 CLINICAL DATA:  Cough and fevers EXAM: PORTABLE CHEST 1 VIEW COMPARISON:  None. FINDINGS: Cardiac shadow is within normal limits. The lungs are well aerated without focal infiltrate. No bony abnormality is seen. IMPRESSION: No active disease. Electronically Signed   By: Inez Catalina M.D.   On: 05/01/2020 10:40   CT Renal Stone Study  Result Date: 05/01/2020 CLINICAL DATA:  Flank pain EXAM: CT ABDOMEN AND PELVIS WITHOUT CONTRAST TECHNIQUE: Multidetector CT imaging of the abdomen and pelvis was performed following the standard protocol without IV contrast. COMPARISON:  None. FINDINGS: Lower chest: Minimal atelectatic changes are noted in the right base. Hepatobiliary: No focal liver abnormality is seen. No gallstones, gallbladder wall thickening, or biliary dilatation. Pancreas: Unremarkable. No pancreatic ductal dilatation or surrounding inflammatory changes. Spleen: Normal in size without focal abnormality. Adrenals/Urinary Tract: Adrenal glands are within normal limits. Kidneys demonstrate bilateral hydronephrotic changes slightly worse on the left than the right. No renal calculi are seen. There is however a distal left ureteral stone which measures almost 7 mm in greatest dimension. No ureteral stone is noted on the right. The bladder is partially distended. Stomach/Bowel: Mild diverticular changes noted without evidence of diverticulitis. The appendix is within normal limits. No small bowel or gastric abnormality is seen. Vascular/Lymphatic: Left retroaortic renal vein is noted. No enlarged abdominal or pelvic  lymph nodes. Reproductive: Prostate is unremarkable. Other: No abdominal wall hernia or abnormality. No abdominopelvic ascites. Musculoskeletal: No acute or significant osseous findings. IMPRESSION: Bilateral hydronephrosis left greater than right with evidence of a 7 mm distal left ureteral stone. No obstructing ureteral stone on the right is seen. Mild diverticular change without diverticulitis. Electronically Signed   By: Inez Catalina M.D.   On: 05/01/2020 11:52     Assessment/Plan: Sepsis with urinary retention, bilateral hydronephrosis and 41mm left distal ureteral stone.   He had a foley placed with return on 1112ml of urine but he needs cystoscopy with bilateral RTG's and left ureteral stent.   He will need the stone and retention dealt with once the sepsis has resolved.   I reviewed the risks of bleeding, infection, ureteral and urinary tract injury, need for secondary procedures, thrombotic events and anesthetic complications.       No follow-ups on file.    CC: Ezequiel Essex MD.      Jenny Reichmann  Jeffie Pollock 05/01/2020 [id number]

## 2020-05-01 NOTE — ED Notes (Signed)
Patient transported to CT 

## 2020-05-01 NOTE — Progress Notes (Signed)
Pharmacy Antibiotic Note  Brandon Carpenter is a 60 y.o. male admitted on 05/01/2020 with urosepsis.  Pharmacy has been consulted for meropenem dosing.  MD aware of penicillin allergy (anaphylaxis).  Plan: Meropenem 1g IV q 8 hrs. F/u cultures, and narrow as able.  Height: 6\' 1"  (185.4 cm) Weight: 74.8 kg (165 lb) IBW/kg (Calculated) : 79.9  Temp (24hrs), Avg:99.5 F (37.5 C), Min:97.2 F (36.2 C), Max:103.7 F (39.8 C)  Recent Labs  Lab 05/01/20 0909 05/01/20 1103  WBC 10.4  --   CREATININE 1.42*  --   LATICACIDVEN 2.6* 1.0    Estimated Creatinine Clearance: 60.7 mL/min (A) (by C-G formula based on SCr of 1.42 mg/dL (H)).    Allergies  Allergen Reactions  . Penicillins Anaphylaxis and Swelling    "Face and throat swell"    Antimicrobials this admission: Cipro 2/28 x 1 dose Meropenem 2/28 >   Dose adjustments this admission:  Microbiology results: 2/28 BCx x 2 >  2/28 UCx >   Thank you for allowing pharmacy to be a part of this patient's care.  [place] Crane, Roylene Reason, BCCP Clinical Pharmacist  05/01/2020 9:44 PM   [facility] pharmacy phone numbers are listed on [email or web address]

## 2020-05-01 NOTE — Anesthesia Postprocedure Evaluation (Signed)
Anesthesia Post Note  Patient: Brandon Carpenter  Procedure(s) Performed: URETERAL STENT PLACEMENT (Bilateral Urethra)     Patient location during evaluation: PACU Anesthesia Type: General Level of consciousness: awake Pain management: pain level controlled Vital Signs Assessment: post-procedure vital signs reviewed and stable Respiratory status: spontaneous breathing Cardiovascular status: stable Postop Assessment: no apparent nausea or vomiting Anesthetic complications: no   No complications documented.  Last Vitals:  Vitals:   05/01/20 1655 05/01/20 1710  BP: 110/76 119/72  Pulse: 81 81  Resp: (!) 21 (!) 22  Temp:  37.1 C  SpO2: 99% 98%    Last Pain:  Vitals:   05/01/20 1710  TempSrc:   PainSc: 0-No pain                 Evalise Abruzzese

## 2020-05-01 NOTE — Op Note (Signed)
Procedure: Cystoscopy with bilateral retrograde pyelography and interpretation and insertion of left double-J stent.  Preop diagnosis: Sepsis with urinary retention, bilateral hydro and a an 8 mm left distal ureteral stone.  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  Surgeon: Dr. Irine Seal.  Anesthesia: General.  Specimen: None.  Drain: 5 French Foley catheter and 6 [place] by 26 cm left contour double-J stent.  EBL: None.  Complications: None.  Indications: The patient is a 60 year old male who presented to the emergency room with sepsis today he was found to be in urinary retention with residual 1100 mL and bilateral hydronephrosis with an 8 mm left distal ureteral stone.  It was felt that cystoscopy with bilateral retrograde pyelography and insertion of left double-J stent at minimum was indicated.  Procedure: He had been given antibiotics in the emergency room.  He was taken operating room where general anesthetic was induced.  Was placed in lithotomy position and fitted with PAS hose.  His perineum and genitalia were prepped with Betadine solution he was draped in usual sterile fashion.  Cystoscopy was performed using a 23 [place] scope and 30 degree lens.  Examination revealed a normal urethra.  The external sphincter was intact.  The prostatic urethra was approximately 3 to 4 cm in length with some lateral lobe hyperplasia but with a large obstructing middle lobe.  Examination of bladder demonstrated moderate trabeculation with mucosal inflammation but no tumors, or stones were noted.  There was some purulent debris in the bladder.  Ureteral orifices were unremarkable.  The left ureteral orifice was cannulated with 5 French opening catheter and contrast instilled.  The left retrograde pyelogram revealed some J hooking of the ureter but there  was a filling defect consistent with the stone just above the intramural ureter with proximal hydronephrosis.  A sensor wire was then advanced to the kidney and a 6 [place] by 24 cm contour double-J stent was then passed over the wire without difficulty under fluoroscopic guidance.  Wire was removed, leaving a good coil in the kidney and a good coil in the bladder.  The right ureteral orifice was cannulated with a 5 [place] open-ended catheter and contrast was instilled.  There was some dilation of the ureter throughout but no filling defects and upon removal of the ureteral catheter there was brisk efflux into the bladder.  There was no evidence of obstruction.  The cystoscope was removed and a 16 French Foley catheter was inserted.  The balloon was filled with 10 mL of sterile fluid.  The cath was placed to straight drainage.  He was taken down from lithotomy position, his anesthetic was reversed and he was moved recovery in stable condition.  There were no complications.
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
## 2020-05-01 NOTE — ED Provider Notes (Addendum)
[facility] DEPARTMENT Provider Note   CSN: [id number] Arrival date & time: 05/01/20  7035     History Chief Complaint  Patient presents with   Shortness of Breath    Brandon Carpenter is a 60 y.o. male.  HPI   Patient with no significant medical history presents to the emergency department with chief complaint of shaking and difficulty with urination.  Patient endorses that the shakiness started 2 days ago, he states his whole body will start to shake, it worsens when he has difficulty urinating.  He endorses that he has had a fever  with a nonproductive cough, and a slight headache since yesterday, he denies nasal congestion, sore throat, abdominal pain, nausea, vomiting, diarrhea, general body aches.  He endorses that he is vaccine against COVID-19, denies recent sick contacts, is not immunocompromise.  Patient also states states that he has been having trouble urinating for the last 2 months.  He denies dysuria, hematuria, penile discharge, testicular pain, flank pain, but does endorse slight pain around his bladder area.  He has no significant medical history of kidney stones, testicular torsion, recent trauma to the area, no abdominal history.  He was recently seen at urgent care where UA was slightly abnormal with leukocytes and hematuria, cultures were obtained and started on Keflex.  Patient did not start the medication as she was unable to get them because the pharmacy was closed.  Patient denies any alleviating factors.  Patient denies chest pain, shortness of breath, diarrhea, worsening pedal edema. History reviewed. No pertinent past medical history.  Patient Active Problem List   Diagnosis Date Noted   Sepsis ([place]) 05/01/2020    History reviewed. No pertinent surgical history.     History reviewed. No pertinent family history.  Social History   Tobacco Use   Smoking status: Never Smoker   Smokeless tobacco: Never Used  Substance Use  Topics   Alcohol use: Yes    Comment: occ   Drug use: Never    Home Medications Prior to Admission medications   Medication Sig Start Date End Date Taking? Authorizing Provider  AZO URINARY PAIN RELIEF 95 MG tablet Take 95 mg by mouth 3 (three) times daily as needed for pain.   Yes [provider]  ibuprofen (ADVIL) 200 MG tablet Take 400 mg by mouth every 6 (six) hours as needed (for pain).   Yes [provider]  sulfamethoxazole-trimethoprim (BACTRIM DS) 800-160 MG tablet Take 1 tablet by mouth 2 (two) times daily for 7 days. 04/30/20 05/07/20  Hazel Sams, PA-C    Allergies    Penicillins  Review of Systems   Review of Systems  Constitutional: Positive for chills and fever.  HENT: Negative for congestion and sore throat.   Respiratory: Positive for cough. Negative for shortness of breath.   Cardiovascular: Negative for chest pain.  Gastrointestinal: Negative for abdominal pain, blood in stool, diarrhea and vomiting.  Genitourinary: Positive for difficulty urinating and frequency. Negative for decreased urine volume, enuresis, flank pain, hematuria, penile swelling, scrotal swelling and testicular pain.  Musculoskeletal: Negative for back pain.  Skin: Negative for rash.  Neurological: Positive for headaches. Negative for dizziness.  Hematological: Does not bruise/bleed easily.    Physical Exam Updated Vital Signs BP 106/78    Pulse 73    Temp 98.4 F (36.9 C) (Oral)    Resp 18    Ht 6' 1"  (1.854 m)    Wt 74.8 kg    SpO2 99%  BMI 21.77 kg/m   Physical Exam Vitals and nursing note reviewed. Exam conducted with a chaperone present.  Constitutional:      General: He is not in acute distress.    Appearance: He is not ill-appearing.  HENT:     Head: Normocephalic and atraumatic.     Right Ear: Ear canal and external ear normal. There is impacted cerumen.     Left Ear: Ear canal and external ear normal. There is impacted cerumen.     Nose: No  congestion.     Mouth/Throat:     Mouth: Mucous membranes are moist.     Pharynx: Oropharynx is clear. No oropharyngeal exudate or posterior oropharyngeal erythema.  Eyes:     Conjunctiva/sclera: Conjunctivae normal.  Cardiovascular:     Rate and Rhythm: Regular rhythm. Tachycardia present.     Pulses: Normal pulses.     Heart sounds: No murmur heard. No friction rub. No gallop.   Pulmonary:     Effort: No respiratory distress.     Breath sounds: No wheezing, rhonchi or rales.  Abdominal:     Palpations: Abdomen is soft.     Tenderness: There is no abdominal tenderness. There is no right CVA tenderness or left CVA tenderness.  Genitourinary:    Penis: Normal.      Comments: With chaperone present, genital exam was performed, normal-appearing penile shaft and testicles, no discharge or rash present.  Testicles were nontender to palpation. Musculoskeletal:     Right lower leg: No edema.     Left lower leg: No edema.  Skin:    General: Skin is warm and dry.  Neurological:     Mental Status: He is alert.  Psychiatric:        Mood and Affect: Mood normal.     ED Results / Procedures / Treatments   Labs (all labs ordered are listed, but only abnormal results are displayed) Labs Reviewed  COMPREHENSIVE METABOLIC PANEL - Abnormal; Notable for the following components:      Result Value   Sodium 132 (*)    CO2 20 (*)    Glucose, Bld 198 (*)    Creatinine, Ser 1.42 (*)    Calcium 8.2 (*)    Total Protein 6.3 (*)    Albumin 3.1 (*)    AST 57 (*)    Total Bilirubin 1.3 (*)    GFR, Estimated 58 (*)    All other components within normal limits  CBC WITH DIFFERENTIAL/PLATELET - Abnormal; Notable for the following components:   Platelets 143 (*)    Neutro Abs 8.5 (*)    Abs Immature Granulocytes 0.09 (*)    All other components within normal limits  LACTIC ACID, PLASMA - Abnormal; Notable for the following components:   Lactic Acid, Venous 2.6 (*)    All other components  within normal limits  URINALYSIS, ROUTINE W REFLEX MICROSCOPIC - Abnormal; Notable for the following components:   APPearance HAZY (*)    Glucose, UA 50 (*)    Hgb urine dipstick MODERATE (*)    Protein, ur 30 (*)    Leukocytes,Ua LARGE (*)    WBC, UA >50 (*)    Bacteria, UA MANY (*)    All other components within normal limits  TROPONIN I (HIGH SENSITIVITY) - Abnormal; Notable for the following components:   Troponin I (High Sensitivity) 68 (*)    All other components within normal limits  TROPONIN I (HIGH SENSITIVITY) - Abnormal; Notable for the following components:  Troponin I (High Sensitivity) 373 (*)    All other components within normal limits  TROPONIN I (HIGH SENSITIVITY) - Abnormal; Notable for the following components:   Troponin I (High Sensitivity) 397 (*)    All other components within normal limits  CULTURE, BLOOD (ROUTINE X 2)  CULTURE, BLOOD (ROUTINE X 2)  RESP PANEL BY RT-PCR (FLU A&B, COVID) ARPGX2  URINE CULTURE  LACTIC ACID, PLASMA  HIV ANTIBODY (ROUTINE TESTING W REFLEX)  MAGNESIUM  TROPONIN I (HIGH SENSITIVITY)    EKG EKG Interpretation  Date/Time:  Monday May 01 2020 08:47:43 EST Ventricular Rate:  118 PR Interval:    QRS Duration: 85 QT Interval:  298 QTC Calculation: 418 R Axis:   85 Text Interpretation: Sinus tachycardia Ventricular premature complex Borderline T abnormalities, diffuse leads Confirmed by Davonna Belling [phone]) on 05/01/2020 9:01:52 AM   Radiology CT Angio Chest PE W and/or Wo Contrast  Result Date: 05/01/2020 CLINICAL DATA:  Shortness of breath and nonproductive cough EXAM: CT ANGIOGRAPHY CHEST WITH CONTRAST TECHNIQUE: Multidetector CT imaging of the chest was performed using the standard protocol during bolus administration of intravenous contrast. Multiplanar CT image reconstructions and MIPs were obtained to evaluate the vascular anatomy. CONTRAST:  17m OMNIPAQUE IOHEXOL 350 MG/ML SOLN COMPARISON:  Chest x-ray from  earlier in the same day. FINDINGS: Cardiovascular: Thoracic aorta and its branches are within normal limits. No significant coronary calcifications are noted. No cardiac enlargement is seen. No pericardial effusion is seen. The pulmonary artery is well visualized within normal branching pattern bilaterally. No filling defects to suggest pulmonary emboli are identified. Mediastinum/Nodes: Thoracic inlet is within normal limits. No sizable hilar or mediastinal adenopathy is noted. Esophagus as visualized within normal limits. Lungs/Pleura: Minimal right basilar atelectasis is noted. No focal infiltrate or sizable effusion is seen. No sizable parenchymal nodule is noted. Upper Abdomen: Visualized upper abdomen demonstrates a hypodensity within the left lobe of the liver best seen image number 309 of series 7 likely representing a cyst but incompletely evaluated on this exam. It is better visualized on this exam than on the prior precontrast images. Musculoskeletal: Mild degenerative changes of the thoracic spine are noted. Review of the MIP images confirms the above findings. IMPRESSION: No evidence of pulmonary emboli. Mild right basilar atelectasis. Hypodense lesion in the left lobe of the liver better visualized than on the prior precontrast image likely representing a cyst but incompletely evaluated on this exam. Nonemergent ultrasound could be performed for clarification as clinically necessary. Electronically Signed   By: MInez CatalinaM.D.   On: 05/01/2020 11:57   DG Chest Port 1 View  Result Date: 05/01/2020 CLINICAL DATA:  Cough and fevers EXAM: PORTABLE CHEST 1 VIEW COMPARISON:  None. FINDINGS: Cardiac shadow is within normal limits. The lungs are well aerated without focal infiltrate. No bony abnormality is seen. IMPRESSION: No active disease. Electronically Signed   By: MInez CatalinaM.D.   On: 05/01/2020 10:40   CT Renal Stone Study  Result Date: 05/01/2020 CLINICAL DATA:  Flank pain EXAM: CT ABDOMEN  AND PELVIS WITHOUT CONTRAST TECHNIQUE: Multidetector CT imaging of the abdomen and pelvis was performed following the standard protocol without IV contrast. COMPARISON:  None. FINDINGS: Lower chest: Minimal atelectatic changes are noted in the right base. Hepatobiliary: No focal liver abnormality is seen. No gallstones, gallbladder wall thickening, or biliary dilatation. Pancreas: Unremarkable. No pancreatic ductal dilatation or surrounding inflammatory changes. Spleen: Normal in size without focal abnormality. Adrenals/Urinary Tract: Adrenal glands are within normal  limits. Kidneys demonstrate bilateral hydronephrotic changes slightly worse on the left than the right. No renal calculi are seen. There is however a distal left ureteral stone which measures almost 7 mm in greatest dimension. No ureteral stone is noted on the right. The bladder is partially distended. Stomach/Bowel: Mild diverticular changes noted without evidence of diverticulitis. The appendix is within normal limits. No small bowel or gastric abnormality is seen. Vascular/Lymphatic: Left retroaortic renal vein is noted. No enlarged abdominal or pelvic lymph nodes. Reproductive: Prostate is unremarkable. Other: No abdominal wall hernia or abnormality. No abdominopelvic ascites. Musculoskeletal: No acute or significant osseous findings. IMPRESSION: Bilateral hydronephrosis left greater than right with evidence of a 7 mm distal left ureteral stone. No obstructing ureteral stone on the right is seen. Mild diverticular change without diverticulitis. Electronically Signed   By: Inez Catalina M.D.   On: 05/01/2020 11:52    Procedures .Critical Care Performed by: Marcello Fennel, PA-C Authorized by: Marcello Fennel, PA-C   Critical care provider statement:    Critical care time (minutes):  45   Critical care time was exclusive of:  Separately billable procedures and treating other patients   Critical care was necessary to treat or prevent  imminent or life-threatening deterioration of the following conditions:  Sepsis   Critical care was time spent personally by me on the following activities:  Discussions with consultants, evaluation of patient's response to treatment, examination of patient, ordering and performing treatments and interventions, ordering and review of laboratory studies, ordering and review of radiographic studies, pulse oximetry, re-evaluation of patient's condition and review of old charts   I assumed direction of critical care for this patient from another provider in my specialty: no     Care discussed with: admitting provider       Medications Ordered in ED Medications  lactated ringers infusion ( Intravenous New Bag/Given 05/01/20 1445)  water for irrigation, sterile for irrigation SOLN (3,000 mLs  Given 05/01/20 1452)  sodium chloride flush (NS) 0.9 % injection 3 mL (has no administration in time range)  sodium chloride flush (NS) 0.9 % injection 3 mL (has no administration in time range)  0.9 %  sodium chloride infusion (has no administration in time range)  polyethylene glycol (MIRALAX / GLYCOLAX) packet 17 g (has no administration in time range)  senna (SENOKOT) tablet 8.6 mg (has no administration in time range)  acetaminophen (TYLENOL) tablet 650 mg (650 mg Oral Given 05/01/20 0918)  sodium chloride 0.9 % bolus 1,000 mL (0 mLs Intravenous Stopped 05/01/20 1102)  ciprofloxacin (CIPRO) IVPB 200 mg (0 mg Intravenous Stopped 05/01/20 1246)  iohexol (OMNIPAQUE) 350 MG/ML injection 98 mL (98 mLs Intravenous Contrast Given 05/01/20 1138)  chlorhexidine (PERIDEX) 0.12 % solution 15 mL (15 mLs Mouth/Throat Given 05/01/20 1446)    ED Course  I have reviewed the triage vital signs and the nursing notes.  Pertinent labs & imaging results that were available during my care of the patient were reviewed by me and considered in my medical decision making (see chart for details).    MDM Rules/Calculators/A&P                           Initial impression-patient presents with shaking, fever, difficulty urination.  He is alert, does not appear in acute distress, vital signs notable for tachycardia, oral temperature of 103.  Concern for sepsis versus URI versus UTI.  Will obtain basic lab work, EKG, blood cultures,  provide patient with Tylenol and reevaluate.  Work-up-CBC unremarkable.  CMP shows hyponatremia 132, decreased CO2 of 20, hyperglycemia 198, elevated creatinine 142, T bili of 1.3.  Respiratory panel negative for influenza/Covid.  First troponin is 68, secound 378 .  UA shows large leukocytes, many white blood cells, many bacteria.  Lactic 2.6.  CT angio negative for acute findings.  CT renal shows bilateral hydronephrosis with 7 mm stone noted in the left ureter.  Consult spoke with Dr. Jeffie Pollock of urology, he would like to take the patient to the OR for a stent.  He recommends making the patient n.p.o., placing a Foley catheter, and start him on meropenem.  He recommends admission to medicine.  Spoke with Dr. Farris Has of the cardiology team, he does not recommend starting patient on heparin or bring the patient to Cath Lab as this would delay stenting of his ureter.  He recommends an echo and continuing trending of the troponins.  He will be available for consult if necessary.  Spoke with Dr. Jeani Hawking of the hospitalist team, she is excepted the patient.  She will come down and evaluate.  Reassessment patient was reassessed, continuing to have no complaints at this time, vital signs have improved.  Patient is noted to have lactic of 2.6, with a urine consistent with a UTI.  Concern for possible pyelonephritis.  Will obtain imaging for further evaluation.  Patient is noted to have an elevated troponin of 68, tachycardia has improved EKG shows  no notable changes for signs of ischemia.  Patient has no history of ACS or PEs.  Patient has unexplained elevated troponins will obtain CT angio to rule out PE.   CT angios  negative for PE, CT renal shows bilateral hydronephrosis worsening on the left with a 7 mm ureter stone.  I am concerned for septic stone will consult with urology for further evaluation.  Patient continues to have an up trending troponin second opponent is 373, doubt this is cardiac related most like secondary due to sepsis will consult with cardiology for further recommendations.  Rule out-I have low suspicion for ACS as patient denies chest pain, shortness of breath, no signs of hypoperfusion fluid overload on my exam, EKG sinus tach without signs of ischemia.  Patient does have elevated troponins but I suspect this is secondary due to sepsis.  We will continue to trend.  Low suspicion for PE as PE scan is negative.  Low suspicion for gallbladder abnormality as she has no right upper quadrant pain, no elevation liver enzymes or alk phos.  Low suspicion for pneumoperitoneum as abdomen is nondistended, dull to percussion, no peritoneal signs on my exam.  Patient has noted increased creatinine of 1.42, I suspect this secondary due to urinary retention.  Plan-suspect patient suffering from a septic stone which caused him to have a septic presentation.  Anticipate she will need stenting and further follow-up with urology.  I suspect patient's elevated troponin secondary due to sepsis, anticipate he will need continued monitoring.  Patient care be transferred to admitting team.    Final Clinical Impression(s) / ED Diagnoses Final diagnoses:  Sepsis without acute organ dysfunction, due to unspecified organism [facility])  Kidney stone    Rx / DC Orders ED Discharge Orders    None       Aron Baba 05/01/20 1507    Davonna Belling, MD 05/01/20 1540    Marcello Fennel, PA-C 05/05/20 7829    Davonna Belling, MD 05/06/20 [phone]

## 2020-05-02 ENCOUNTER — Inpatient Hospital Stay (HOSPITAL_COMMUNITY): Payer: 59

## 2020-05-02 ENCOUNTER — Encounter (HOSPITAL_COMMUNITY): Payer: Self-pay | Admitting: Urology

## 2020-05-02 DIAGNOSIS — R339 Retention of urine, unspecified: Secondary | ICD-10-CM | POA: Diagnosis present

## 2020-05-02 DIAGNOSIS — A4151 Sepsis due to Escherichia coli [E. coli]: Principal | ICD-10-CM

## 2020-05-02 DIAGNOSIS — R7989 Other specified abnormal findings of blood chemistry: Secondary | ICD-10-CM | POA: Diagnosis not present

## 2020-05-02 DIAGNOSIS — I361 Nonrheumatic tricuspid (valve) insufficiency: Secondary | ICD-10-CM | POA: Diagnosis not present

## 2020-05-02 DIAGNOSIS — A419 Sepsis, unspecified organism: Secondary | ICD-10-CM

## 2020-05-02 DIAGNOSIS — N200 Calculus of kidney: Secondary | ICD-10-CM

## 2020-05-02 DIAGNOSIS — R0682 Tachypnea, not elsewhere classified: Secondary | ICD-10-CM

## 2020-05-02 DIAGNOSIS — N132 Hydronephrosis with renal and ureteral calculous obstruction: Secondary | ICD-10-CM | POA: Insufficient documentation

## 2020-05-02 LAB — COMPREHENSIVE METABOLIC PANEL
ALT: 16 U/L (ref 0–44)
AST: 55 U/L — ABNORMAL HIGH (ref 15–41)
Albumin: 2.7 g/dL — ABNORMAL LOW (ref 3.5–5.0)
Alkaline Phosphatase: 58 U/L (ref 38–126)
Anion gap: 7 (ref 5–15)
BUN: 16 mg/dL (ref 6–20)
CO2: 25 mmol/L (ref 22–32)
Calcium: 8.1 mg/dL — ABNORMAL LOW (ref 8.9–10.3)
Chloride: 105 mmol/L (ref 98–111)
Creatinine, Ser: 1.34 mg/dL — ABNORMAL HIGH (ref 0.61–1.24)
GFR, Estimated: >60 mL/min (ref >60)
Glucose, Bld: 165 mg/dL — ABNORMAL HIGH (ref 70–99)
Potassium: 4 mmol/L (ref 3.5–5.1)
Sodium: 137 mmol/L (ref 135–145)
Total Bilirubin: 0.7 mg/dL (ref 0.3–1.2)
Total Protein: 5.8 g/dL — ABNORMAL LOW (ref 6.5–8.1)

## 2020-05-02 LAB — BLOOD CULTURE ID PANEL (REFLEXED) - BCID2

## 2020-05-02 LAB — ECHOCARDIOGRAM COMPLETE
AR max vel: 2.6 cm2
AV Area VTI: 2.63 cm2
AV Area mean vel: 2.64 cm2
AV Mean grad: 4 mmHg
AV Peak grad: 6.7 mmHg
Ao pk vel: 1.29 m/s
Area-P 1/2: 1.71 cm2
Height: 73 in
MV VTI: 2.68 cm2
S' Lateral: 3.3 cm
Weight: 2640 oz

## 2020-05-02 LAB — CBC
HCT: 36.7 % — ABNORMAL LOW (ref 39.0–52.0)
Hemoglobin: 12.4 g/dL — ABNORMAL LOW (ref 13.0–17.0)
MCH: 30.8 pg (ref 26.0–34.0)
MCHC: 33.8 g/dL (ref 30.0–36.0)
MCV: 91.1 fL (ref 80.0–100.0)
Platelets: 153 10*3/uL (ref 150–400)
RBC: 4.03 MIL/uL — ABNORMAL LOW (ref 4.22–5.81)
RDW: 13.3 % (ref 11.5–15.5)
WBC: 13.5 10*3/uL — ABNORMAL HIGH (ref 4.0–10.5)
nRBC: 0 % (ref 0.0–0.2)

## 2020-05-02 LAB — URINE CULTURE: Culture: >=100000 — AB

## 2020-05-02 LAB — MAGNESIUM: Magnesium: 2 mg/dL (ref 1.7–2.4)

## 2020-05-02 MED ORDER — TAMSULOSIN HCL 0.4 MG PO CAPS
0.4000 mg | ORAL_CAPSULE | Freq: Every day | ORAL | Status: DC
  Administered 2020-05-02 – 2020-05-03 (×2): 0.4 mg via ORAL
  Filled 2020-05-02 (×2): qty 1

## 2020-05-02 MED ORDER — SODIUM CHLORIDE 0.9 % IV SOLN
2.0000 g | INTRAVENOUS | Status: DC
  Administered 2020-05-02: 2 g via INTRAVENOUS
  Filled 2020-05-02: qty 20
  Filled 2020-05-02: qty 2

## 2020-05-02 MED ORDER — ENOXAPARIN SODIUM 40 MG/0.4ML ~~LOC~~ SOLN
40.0000 mg | SUBCUTANEOUS | Status: DC
  Administered 2020-05-02: 40 mg via SUBCUTANEOUS
  Filled 2020-05-02: qty 0.4

## 2020-05-02 MED ORDER — FINASTERIDE 5 MG PO TABS
5.0000 mg | ORAL_TABLET | Freq: Every day | ORAL | Status: DC
  Administered 2020-05-02 – 2020-05-03 (×2): 5 mg via ORAL
  Filled 2020-05-02 (×2): qty 1

## 2020-05-02 MED ORDER — ACETAMINOPHEN 325 MG PO TABS
650.0000 mg | ORAL_TABLET | Freq: Four times a day (QID) | ORAL | Status: DC | PRN
  Administered 2020-05-02 – 2020-05-03 (×2): 650 mg via ORAL
  Filled 2020-05-02 (×3): qty 2

## 2020-05-02 NOTE — Progress Notes (Addendum)
Family Medicine Teaching Service Daily Progress Note Intern Pager: [phone]  Patient name: Brandon Carpenter Medical record number: [id number] Date of birth: 11/30/1960 Age: 60 y.o. Gender: male  Primary Care Provider: Patient, No Pcp Per Consultants: Urology Code Status: Full  Pt Overview and Major Events to Date:  2/28 - Admitted, ureteral stent placed  Assessment and Plan: Brandon Carpenter is a 60 y.o. male presenting with urinary difficulties, shakiness, fever, cough, and headache. No significant PMH  Severe E. Coli sepsis, improving Overnight tachycardia and tachypnea improved, blood pressures normotensive. Patient currently afebrile. S/p ureteral stent and foley catheter placement on 2/28. Urine culture positive for E. Coli. S/p 1 dose of cipro on 2/28, currently on Meropenem day 2 (antibiotic day 2/10); anticipating transition to oral medication once sensitivities result. - continuous cardiac telemetry with pulse ox - switching from meropenem to CTX, anticipate transition to oral keflex upon discharge  - vitals per unit routine - monitor for fever   BPH S/p ureteral stent on 2/28. Will follow-up with urology outpatient with voiding trial. Left distal stone will need ureteroscopy in next 1-2 weeks.  - Urology consulted, appreciate recs - Patient started on tamsulosin and finasteride per urology   AKI, improving Cr 1.42>1.34, possibly post-renal. See above for relevant interventions. Anticipate further improvement.  - AM BMPs  Elevated liver enzymes AST 55. ALT 16, Alk phos 58  - monitor CMP  Hyperglycemia CBG 165. Likely elevated in the setting of acute illness. No known recent steroids.  - A1c with morning labs  Elevated Troponin, trended flat Troponin trended flat 68>373>397>365, likely demand ischemia. Cardiology was consulted initially with no recommendation for intervention. Echo was recommended  - pending echo - cardiac monitoring  Thrombocytopenia,  improved Platelet trend 143>153. Possibly likely reactive but unsure of exact etiology.  -monitor CBC    FEN/GI: Regular PPx: Lovenox restarting today   Status is: Inpatient  Remains inpatient appropriate because:Ongoing diagnostic testing needed not appropriate for outpatient work up and IV treatments appropriate due to intensity of illness or inability to take PO   Dispo: The patient is from: Home              Anticipated d/c is to: Home              Patient currently is not medically stable to d/c.   Difficult to place patient No    Subjective:  Patient reports that he is doing much better this morning. He denies having any lower abdominal pain currently. Patient spoke with urology and is preferring medication instead of TURP regarding his BPH.  Objective: Temp:  [97.2 F (36.2 C)-100 F (37.8 C)] 98.8 F (37.1 C) (03/01 0508) Pulse Rate:  [66-117] 66 (03/01 0508) Resp:  [13-26] 16 (03/01 0508) BP: (100-156)/(61-114) 110/78 (03/01 0508) SpO2:  [95 %-100 %] 98 % (03/01 0508) Physical Exam: General: NAD, sitting up in bed, well-appearing Cardiovascular: RRR, no m/r/g appreciated, no peripheral edema Respiratory: CTAB, no increased WOB, comfortable on room air Abdomen: soft, non-tender, non-distended Extremities: no peripheral edema noted, SCDs on extremities  Laboratory: Recent Labs  Lab 05/01/20 0909 05/02/20 0053  WBC 10.4 13.5*  HGB 13.6 12.4*  HCT 41.4 36.7*  PLT 143* 153   Recent Labs  Lab 05/01/20 0909 05/02/20 0053  NA 132* 137  K 3.8 4.0  CL 101 105  CO2 20* 25  BUN 16 16  CREATININE 1.42* 1.34*  CALCIUM 8.2* 8.1*  PROT 6.3* 5.8*  BILITOT 1.3* 0.7  ALKPHOS  79 58  ALT 18 16  AST 57* 55*  GLUCOSE 198* 165*   Imaging/Diagnostic Tests: CT Angio Chest PE W and/or Wo Contrast  Result Date: 05/01/2020 CLINICAL DATA:  Shortness of breath and nonproductive cough EXAM: CT ANGIOGRAPHY CHEST WITH CONTRAST TECHNIQUE: Multidetector CT imaging of the  chest was performed using the standard protocol during bolus administration of intravenous contrast. Multiplanar CT image reconstructions and MIPs were obtained to evaluate the vascular anatomy. CONTRAST:  38m OMNIPAQUE IOHEXOL 350 MG/ML SOLN COMPARISON:  Chest x-ray from earlier in the same day. FINDINGS: Cardiovascular: Thoracic aorta and its branches are within normal limits. No significant coronary calcifications are noted. No cardiac enlargement is seen. No pericardial effusion is seen. The pulmonary artery is well visualized within normal branching pattern bilaterally. No filling defects to suggest pulmonary emboli are identified. Mediastinum/Nodes: Thoracic inlet is within normal limits. No sizable hilar or mediastinal adenopathy is noted. Esophagus as visualized within normal limits. Lungs/Pleura: Minimal right basilar atelectasis is noted. No focal infiltrate or sizable effusion is seen. No sizable parenchymal nodule is noted. Upper Abdomen: Visualized upper abdomen demonstrates a hypodensity within the left lobe of the liver best seen image number 309 of series 7 likely representing a cyst but incompletely evaluated on this exam. It is better visualized on this exam than on the prior precontrast images. Musculoskeletal: Mild degenerative changes of the thoracic spine are noted. Review of the MIP images confirms the above findings. IMPRESSION: No evidence of pulmonary emboli. Mild right basilar atelectasis. Hypodense lesion in the left lobe of the liver better visualized than on the prior precontrast image likely representing a cyst but incompletely evaluated on this exam. Nonemergent ultrasound could be performed for clarification as clinically necessary. Electronically Signed   By: MInez CatalinaM.D.   On: 05/01/2020 11:57   DG Retrograde Pyelogram  Result Date: 05/01/2020 CLINICAL DATA:  Left hydronephrosis and distal ureteral calculus. EXAM: INTRAOPERATIVE LEFT RETROGRADE UROGRAPHY TECHNIQUE: Images  were obtained with the C-arm fluoroscopic device intraoperatively and submitted for interpretation post-operatively. Please see the procedural report for the amount of contrast and the fluoroscopy time utilized. COMPARISON:  CT of the abdomen and pelvis without contrast earlier today. FINDINGS: Intraoperative images were obtained which demonstrate placement of a left ureteral stent which extends from the lower pole collecting system to the bladder. There is faint contrast opacification the left renal pelvis and part of the lower left renal collecting system. IMPRESSION: Left ureteral stent placement. Electronically Signed   By: GAletta EdouardM.D.   On: 05/01/2020 16:03   DG Chest Port 1 View  Result Date: 05/01/2020 CLINICAL DATA:  Cough and fevers EXAM: PORTABLE CHEST 1 VIEW COMPARISON:  None. FINDINGS: Cardiac shadow is within normal limits. The lungs are well aerated without focal infiltrate. No bony abnormality is seen. IMPRESSION: No active disease. Electronically Signed   By: MInez CatalinaM.D.   On: 05/01/2020 10:40   ECHOCARDIOGRAM COMPLETE  Result Date: 05/02/2020    ECHOCARDIOGRAM REPORT   Patient Name:   KMENNO VANBERGENDate of Exam: 05/02/2020 Medical Rec #:  [id number]       Height:       73.0 in Accession #:    [id number]      Weight:       165.0 lb Date of Birth:  [birth year]        BSA:          1.983 m Patient Age:    89years  BP:           110/78 mmHg Patient Gender: M                HR:           66 bpm. Exam Location:  Inpatient Procedure: 2D Echo, Cardiac Doppler and Color Doppler STAT ECHO Indications:   Elevated troponin  History:       Patient has no prior history of Echocardiogram examinations.                Sepsis.  Sonographer:   Clayton Lefort RDCS (AE) Referring      [place] Phys: IMPRESSIONS  1. Inferior basal hypokinesis . Left ventricular ejection fraction, by estimation, is 50 to 55%. The left ventricle has low normal function. The left ventricle has no regional  wall motion abnormalities. Left ventricular diastolic parameters were normal.  2. Right ventricular systolic function is normal. The right ventricular size is normal.  3. The mitral valve is normal in structure. Trivial mitral valve regurgitation. No evidence of mitral stenosis.  4. The aortic valve is tricuspid. Aortic valve regurgitation is not visualized. No aortic stenosis is present.  5. The inferior vena cava is normal in size with greater than 50% respiratory variability, suggesting right atrial pressure of 3 mmHg. FINDINGS  Left Ventricle: Inferior basal hypokinesis. Left ventricular ejection fraction, by estimation, is 50 to 55%. The left ventricle has low normal function. The left ventricle has no regional wall motion abnormalities. The left ventricular internal cavity size was normal in size. There is no left ventricular hypertrophy. Left ventricular diastolic parameters were normal. Right Ventricle: The right ventricular size is normal. No increase in right ventricular wall thickness. Right ventricular systolic function is normal. Left Atrium: Left atrial size was normal in size. Right Atrium: Right atrial size was not well visualized. Pericardium: There is no evidence of pericardial effusion. Mitral Valve: The mitral valve is normal in structure. Trivial mitral valve regurgitation. No evidence of mitral valve stenosis. MV peak gradient, 1.9 mmHg. The mean mitral valve gradient is 1.0 mmHg. Tricuspid Valve: The tricuspid valve is normal in structure. Tricuspid valve regurgitation is mild . No evidence of tricuspid stenosis. Aortic Valve: The aortic valve is tricuspid. Aortic valve regurgitation is not visualized. No aortic stenosis is present. Aortic valve mean gradient measures 4.0 mmHg. Aortic valve peak gradient measures 6.7 mmHg. Aortic valve area, by VTI measures 2.63 cm. Pulmonic Valve: The pulmonic valve was normal in structure. Pulmonic valve regurgitation is not visualized. No evidence of  pulmonic stenosis. Aorta: The aortic root is normal in size and structure. Venous: The inferior vena cava is normal in size with greater than 50% respiratory variability, suggesting right atrial pressure of 3 mmHg. IAS/Shunts: No atrial level shunt detected by color flow Doppler.  LEFT VENTRICLE PLAX 2D LVIDd:         4.70 cm  Diastology LVIDs:         3.30 cm  LV e' medial:    8.92 cm/s LV PW:         1.30 cm  LV E/e' medial:  6.6 LV IVS:        1.10 cm  LV e' lateral:   9.57 cm/s LVOT diam:     2.20 cm  LV E/e' lateral: 6.1 LV SV:         69 LV SV Index:   35 LVOT Area:     3.80 cm  RIGHT VENTRICLE  IVC RV Basal diam:  3.40 cm     IVC diam: 2.00 cm RV S prime:     10.70 cm/s TAPSE (M-mode): 2.6 cm LEFT ATRIUM             Index       RIGHT ATRIUM           Index LA diam:        3.10 cm 1.56 cm/m  RA Area:     14.10 cm LA Vol (A2C):   44.5 ml 22.44 ml/m RA Volume:   34.90 ml  17.60 ml/m LA Vol (A4C):   34.2 ml 17.25 ml/m LA Biplane Vol: 42.8 ml 21.59 ml/m  AORTIC VALVE AV Area (Vmax):    2.60 cm AV Area (Vmean):   2.64 cm AV Area (VTI):     2.63 cm AV Vmax:           129.00 cm/s AV Vmean:          91.600 cm/s AV VTI:            0.263 m AV Peak Grad:      6.7 mmHg AV Mean Grad:      4.0 mmHg LVOT Vmax:         88.10 cm/s LVOT Vmean:        63.600 cm/s LVOT VTI:          0.182 m LVOT/AV VTI ratio: 0.69  AORTA Ao Root diam: 3.50 cm Ao Asc diam:  3.00 cm MITRAL VALVE MV Area (PHT): 1.71 cm    SHUNTS MV Area VTI:   2.68 cm    Systemic VTI:  0.18 m MV Peak grad:  1.9 mmHg    Systemic Diam: 2.20 cm MV Mean grad:  1.0 mmHg MV Vmax:       0.68 m/s MV Vmean:      36.1 cm/s MV Decel Time: 443 msec MV E velocity: 58.80 cm/s MV A velocity: 32.70 cm/s MV E/A ratio:  1.80 Jenkins Rouge MD Electronically signed by Jenkins Rouge MD Signature Date/Time: 05/02/2020/8:31:53 AM    Final    CT Renal Stone Study  Result Date: 05/01/2020 CLINICAL DATA:  Flank pain EXAM: CT ABDOMEN AND PELVIS WITHOUT CONTRAST  TECHNIQUE: Multidetector CT imaging of the abdomen and pelvis was performed following the standard protocol without IV contrast. COMPARISON:  None. FINDINGS: Lower chest: Minimal atelectatic changes are noted in the right base. Hepatobiliary: No focal liver abnormality is seen. No gallstones, gallbladder wall thickening, or biliary dilatation. Pancreas: Unremarkable. No pancreatic ductal dilatation or surrounding inflammatory changes. Spleen: Normal in size without focal abnormality. Adrenals/Urinary Tract: Adrenal glands are within normal limits. Kidneys demonstrate bilateral hydronephrotic changes slightly worse on the left than the right. No renal calculi are seen. There is however a distal left ureteral stone which measures almost 7 mm in greatest dimension. No ureteral stone is noted on the right. The bladder is partially distended. Stomach/Bowel: Mild diverticular changes noted without evidence of diverticulitis. The appendix is within normal limits. No small bowel or gastric abnormality is seen. Vascular/Lymphatic: Left retroaortic renal vein is noted. No enlarged abdominal or pelvic lymph nodes. Reproductive: Prostate is unremarkable. Other: No abdominal wall hernia or abnormality. No abdominopelvic ascites. Musculoskeletal: No acute or significant osseous findings. IMPRESSION: Bilateral hydronephrosis left greater than right with evidence of a 7 mm distal left ureteral stone. No obstructing ureteral stone on the right is seen. Mild diverticular change without diverticulitis. Electronically Signed   By: Elta [place]  Lukens M.D.   On: 05/01/2020 11:52     Rise Patience, DO 05/02/2020, 9:42 AM PGY-1, [place] Intern pager: [phone], text pages welcome

## 2020-05-02 NOTE — Progress Notes (Signed)
  Echocardiogram 2D Echocardiogram has been performed.  Brandon Carpenter 05/02/2020, 8:29 AM

## 2020-05-02 NOTE — Evaluation (Signed)
Physical Therapy Evaluation Patient Details Name: Brandon Carpenter MRN: [id number] DOB: April 03, 1960 Today's Date: 05/02/2020   History of Present Illness  Pt is a 60 yo male admitted with shaking, fever, cough and headache.  Neg for covid and flu. Found to have ureteral stone and was septic.  Pt with raised troponin that leveled off most likely due to demand ischemia.  Pt had ureteral stent and foley placed with improving symptoms and will be followed in OP.  Clinical Impression  Patient evaluated by Physical Therapy with no further acute PT needs identified. All education has been completed and the patient has no further questions. Pt is at a supervision level for mobility. He has no follow-up Physical Therapy needs. PT is signing off. Thank you for this referral.     Follow Up Recommendations No PT follow up;Supervision for mobility/OOB    Equipment Recommendations  Rolling walker with 5" wheels       Precautions / Restrictions Precautions Precautions: None Restrictions Weight Bearing Restrictions: No      Mobility  Bed Mobility               General bed mobility comments: up in chair on entry    Transfers Overall transfer level: Needs assistance Equipment used: Rolling walker (2 wheeled) Transfers: Sit to/from Stand Sit to Stand: Supervision         General transfer comment: supervision for safety, Pt concerned about catheter placement on RW but ultimately okay  Ambulation/Gait Ambulation/Gait assistance: Supervision Gait Distance (Feet): 500 Feet Assistive device: Rolling walker (2 wheeled) Gait Pattern/deviations: WFL(Within Functional Limits);Step-through pattern Gait velocity: slowed Gait velocity interpretation: 1.31 - 2.62 ft/sec, indicative of limited community ambulator General Gait Details: supervision for slow steady gait, increased WoB at the end, with complaints of chest tightness RN notified  Stairs Stairs: Yes Stairs assistance: Min  guard Stair Management: One rail Left;Alternating pattern;Step to pattern;Forwards Number of Stairs: 12 General stair comments: min guard for safety, pt trialed both step to and step through pattern for ascent and step to pattern for descent      Balance Overall balance assessment: No apparent balance deficits (not formally assessed)                                           Pertinent Vitals/Pain Pain Assessment: Faces Faces Pain Scale: Hurts a little bit Pain Location: groin area from catheter Pain Descriptors / Indicators: Burning Pain Intervention(s): Monitored during session;Limited activity within patient's tolerance;Repositioned    Home Living Family/patient expects to be discharged to:: Private residence Living Arrangements: Spouse/significant other Available Help at Discharge: Family;Available 24 hours/day Type of Home: House Home Access: Stairs to enter Entrance Stairs-Rails: Can reach both Entrance Stairs-Number of Steps: 6 Home Layout: Two level Home Equipment: None;Shower seat - built in      Prior Function Level of Independence: Independent               Hand Dominance   Dominant Hand: Right    Extremity/Trunk Assessment   Upper Extremity Assessment Upper Extremity Assessment: Defer to OT evaluation    Lower Extremity Assessment Lower Extremity Assessment: Overall WFL for tasks assessed    Cervical / Trunk Assessment Cervical / Trunk Assessment: Normal  Communication   Communication: No difficulties  Cognition Arousal/Alertness: Awake/alert Behavior During Therapy: WFL for tasks assessed/performed Overall Cognitive Status: Within Functional Limits for tasks assessed  General Comments General comments (skin integrity, edema, etc.): slight chest tightness at end of ambulation, RN notified max HR noted 99, wife present for stair training     Assessment/Plan    PT  Assessment Patent does not need any further PT services         PT Goals (Current goals can be found in the Care Plan section)  Acute Rehab PT Goals Patient Stated Goal: to get home PT Goal Formulation: With patient/family     AM-PAC PT "6 Clicks" Mobility  Outcome Measure Help needed turning from your back to your side while in a flat bed without using bedrails?: None Help needed moving from lying on your back to sitting on the side of a flat bed without using bedrails?: None Help needed moving to and from a bed to a chair (including a wheelchair)?: None Help needed standing up from a chair using your arms (e.g., wheelchair or bedside chair)?: None Help needed to walk in hospital room?: None Help needed climbing 3-5 steps with a railing? : A Little 6 Click Score: 23    End of Session Equipment Utilized During Treatment: Gait belt Activity Tolerance: Patient tolerated treatment well Patient left: in chair;with call bell/phone within reach;with family/visitor present Nurse Communication: Mobility status;Other (comment) (pt with c/o chest and abdomen tightness at end of session) PT Visit Diagnosis: Muscle weakness (generalized) (M62.81)    Time: 1224-8250 PT Time Calculation (min) (ACUTE ONLY): 23 min   Charges:   PT Evaluation $PT Eval Moderate Complexity: 1 Mod PT Treatments $Gait Training: 8-22 mins        Savita Runner B. Migdalia Dk PT, DPT Acute Rehabilitation Services Pager [phone] Office [phone]   [place] 05/02/2020, 3:35 PM

## 2020-05-02 NOTE — Evaluation (Signed)
Occupational Therapy Evaluation Patient Details Name: Brandon Carpenter MRN: [id number] DOB: November 30, 1960 Today's Date: 05/02/2020    History of Present Illness Pt is a 60 yo male admitted with shaking, fever, cough and headache.  Neg for covid and flu. Found to have ureteral stone and was septic.  Pt with raised troponin that leveled off most likely due to demand ischemia.  Pt had ureteral stent and foley placed with improving symptoms and will be followed in OP.   Clinical Impression   Pt admitted with the above diagnosis and overall is doing very well with basic adls in the room. Pt completes all basic adls with supervision and walked in hallway with supervision and no physical assist. Pt is very stiff from being in bed the last 24 hours but wife is available to assist at home.  Pt has a flight of stairs to second floor but feel with wife available, pt will be safe to go home. No further OT needed.     Follow Up Recommendations  No OT follow up    Equipment Recommendations  None recommended by OT    Recommendations for Other Services       Precautions / Restrictions Precautions Precautions: None Restrictions Weight Bearing Restrictions: No      Mobility Bed Mobility Overal bed mobility: Needs Assistance Bed Mobility: Supine to Sit     Supine to sit: Supervision     General bed mobility comments: Pt used bedrails and HOB up. Pt very stiff but no physical assist needed.    Transfers Overall transfer level: Needs assistance Equipment used: Rolling walker (2 wheeled) Transfers: Sit to/from Stand Sit to Stand: Supervision         General transfer comment: Pt safe. Reassurance given that pt can walk with foley catheter.    Balance Overall balance assessment: No apparent balance deficits (not formally assessed)                                         ADL either performed or assessed with clinical judgement   ADL Overall ADL's : Needs  assistance/impaired Eating/Feeding: Independent;Sitting   Grooming: Wash/dry hands;Wash/dry face;Oral care;Supervision/safety;Standing   Upper Body Bathing: Set up;Sitting   Lower Body Bathing: Minimal assistance;Sit to/from stand   Upper Body Dressing : Set up;Sitting   Lower Body Dressing: Minimal assistance;Sit to/from stand   Toilet Transfer: Supervision/safety;Ambulation   Toileting- Clothing Manipulation and Hygiene: Supervision/safety;Total assistance (has foley)       Functional mobility during ADLs: Supervision/safety General ADL Comments: Pt did well with all adls. Min assist given with LE adls for balance when first up on feet. Pt had not been out of bed in last 24 hours     Vision Baseline Vision/History: No visual deficits Patient Visual Report: No change from baseline Vision Assessment?: No apparent visual deficits     Perception Perception Perception Tested?: No   Praxis Praxis Praxis tested?: Not tested    Pertinent Vitals/Pain Pain Assessment: Faces Pain Score: 3  Pain Location: groin area from catheter Pain Descriptors / Indicators: Burning Pain Intervention(s): Monitored during session     Hand Dominance Right   Extremity/Trunk Assessment Upper Extremity Assessment Upper Extremity Assessment: Overall WFL for tasks assessed   Lower Extremity Assessment Lower Extremity Assessment: Defer to PT evaluation   Cervical / Trunk Assessment Cervical / Trunk Assessment: Normal   Communication Communication Communication: No difficulties  Cognition Arousal/Alertness: Awake/alert Behavior During Therapy: WFL for tasks assessed/performed Overall Cognitive Status: Within Functional Limits for tasks assessed                                     General Comments  Pt did well for first time up in last 24 hours. Pt moving slow and feels stiff but overall can do basic adls and mobility with extra time given.    Exercises     Shoulder  Instructions      Home Living Family/patient expects to be discharged to:: Private residence Living Arrangements: Spouse/significant other Available Help at Discharge: Family;Available 24 hours/day Type of Home: House Home Access: Stairs to enter [employer] of Steps: 6 Entrance Stairs-Rails: Can reach both Home Layout: Two level Alternate Level Stairs-Number of Steps: 18   Bathroom Shower/Tub: Tub/shower unit;Curtain   Bathroom Toilet: Standard     Home Equipment: None;Shower seat - built in          Prior Functioning/Environment Level of Independence: Independent                 OT Problem List:        OT Treatment/Interventions:      OT Goals(Current goals can be found in the care plan section) Acute Rehab OT Goals Patient Stated Goal: to get home OT Goal Formulation: All assessment and education complete, DC therapy  OT Frequency:     Barriers to D/C:            Co-evaluation              AM-PAC OT "6 Clicks" Daily Activity     Outcome Measure Help from another person eating meals?: None Help from another person taking care of personal grooming?: None Help from another person toileting, which includes using toliet, bedpan, or urinal?: Total Help from another person bathing (including washing, rinsing, drying)?: None Help from another person to put on and taking off regular upper body clothing?: None Help from another person to put on and taking off regular lower body clothing?: A Little 6 Click Score: 20   End of Session Equipment Utilized During Treatment: Rolling walker Nurse Communication: Mobility status  Activity Tolerance: Patient tolerated treatment well Patient left: in chair;with call bell/phone within reach;Other (comment) (pt bathing in bathroom with wife.)  OT Visit Diagnosis: Unsteadiness on feet (R26.81)                Time: 1030-1051 OT Time Calculation (min): 21 min Charges:  OT General Charges $OT Visit: 1  Visit OT Evaluation $OT Eval Low Complexity: 1 Low  Glenford Peers 05/02/2020, 11:05 AM

## 2020-05-02 NOTE — Progress Notes (Signed)
1 Day Post-Op  Subjective: Brandon Carpenter is doing well s/p stent insertion.   He has no pain and his fever has abated.    ROS:  Review of Systems  All other systems reviewed and are negative.   Anti-infectives: Anti-infectives (From admission, onward)   Start     Dose/Rate Route Frequency Ordered Stop   05/01/20 2230  meropenem (MERREM) 1 g in sodium chloride 0.9 % 100 mL IVPB        1 g 200 mL/hr over 30 Minutes Intravenous Every 8 hours 05/01/20 2139     05/01/20 1045  ciprofloxacin (CIPRO) IVPB 200 mg        200 mg 100 mL/hr over 60 Minutes Intravenous  Once 05/01/20 1035 05/01/20 1246      Current Facility-Administered Medications  Medication Dose Route Frequency Provider Last Rate Last Admin  . 0.9 %  sodium chloride infusion  250 mL Intravenous PRN Irine Seal, MD      . Chlorhexidine Gluconate Cloth 2 % PADS 6 each  6 each Topical Daily McDiarmid, Blane Ohara, MD      . lactated ringers infusion   Intravenous Continuous Irine Seal, MD 10 mL/hr at 05/01/20 1445 New Bag at 05/01/20 1608  . meropenem (MERREM) 1 g in sodium chloride 0.9 % 100 mL IVPB  1 g Intravenous Q8H Carney, Jessica C, RPH 200 mL/hr at 05/02/20 0511 1 g at 05/02/20 0511  . polyethylene glycol (MIRALAX / GLYCOLAX) packet 17 g  17 g Oral Daily Irine Seal, MD      . senna [facility]) tablet 8.6 mg  1 tablet Oral Daily PRN Irine Seal, MD      . sodium chloride flush (NS) 0.9 % injection 3 mL  3 mL Intravenous Q12H Irine Seal, MD   3 mL at 05/01/20 2225  . sodium chloride flush (NS) 0.9 % injection 3 mL  3 mL Intravenous PRN Irine Seal, MD         Objective: Vital signs in last 24 hours: Temp:  [97.2 F (36.2 C)-103.7 F (39.8 C)] 98.8 F (37.1 C) (03/01 0508) Pulse Rate:  [66-128] 66 (03/01 0508) Resp:  [13-28] 16 (03/01 0508) BP: (100-156)/(61-114) 110/78 (03/01 0508) SpO2:  [92 %-100 %] 98 % (03/01 0508) Weight:  [74.8 kg] 74.8 kg (02/28 0847)  Intake/Output from previous day: 02/28 0701 - 03/01 0700 In:  2100 [I.V.:1000; IV Piggyback:1100] Out: 2700 [Urine:2700] Intake/Output this shift: Total I/O In: -  Out: 1400 [Urine:1400]   Physical Exam Vitals reviewed.  Constitutional:      Appearance: He is well-developed.  Neurological:     Mental Status: He is alert.     Lab Results:  Recent Labs    05/01/20 0909 05/02/20 0053  WBC 10.4 13.5*  HGB 13.6 12.4*  HCT 41.4 36.7*  PLT 143* 153   BMET Recent Labs    05/01/20 0909 05/02/20 0053  NA 132* 137  K 3.8 4.0  CL 101 105  CO2 20* 25  GLUCOSE 198* 165*  BUN 16 16  CREATININE 1.42* 1.34*  CALCIUM 8.2* 8.1*   PT/INR No results for input(s): LABPROT, INR in the last 72 hours. ABG No results for input(s): PHART, HCO3 in the last 72 hours.  Invalid input(s): PCO2, PO2  Studies/Results: CT Angio Chest PE W and/or Wo Contrast  Result Date: 05/01/2020 CLINICAL DATA:  Shortness of breath and nonproductive cough EXAM: CT ANGIOGRAPHY CHEST WITH CONTRAST TECHNIQUE: Multidetector CT imaging of the chest was performed using  the standard protocol during bolus administration of intravenous contrast. Multiplanar CT image reconstructions and MIPs were obtained to evaluate the vascular anatomy. CONTRAST:  45mL OMNIPAQUE IOHEXOL 350 MG/ML SOLN COMPARISON:  Chest x-ray from earlier in the same day. FINDINGS: Cardiovascular: Thoracic aorta and its branches are within normal limits. No significant coronary calcifications are noted. No cardiac enlargement is seen. No pericardial effusion is seen. The pulmonary artery is well visualized within normal branching pattern bilaterally. No filling defects to suggest pulmonary emboli are identified. Mediastinum/Nodes: Thoracic inlet is within normal limits. No sizable hilar or mediastinal adenopathy is noted. Esophagus as visualized within normal limits. Lungs/Pleura: Minimal right basilar atelectasis is noted. No focal infiltrate or sizable effusion is seen. No sizable parenchymal nodule is noted. Upper  Abdomen: Visualized upper abdomen demonstrates a hypodensity within the left lobe of the liver best seen image number 309 of series 7 likely representing a cyst but incompletely evaluated on this exam. It is better visualized on this exam than on the prior precontrast images. Musculoskeletal: Mild degenerative changes of the thoracic spine are noted. Review of the MIP images confirms the above findings. IMPRESSION: No evidence of pulmonary emboli. Mild right basilar atelectasis. Hypodense lesion in the left lobe of the liver better visualized than on the prior precontrast image likely representing a cyst but incompletely evaluated on this exam. Nonemergent ultrasound could be performed for clarification as clinically necessary. Electronically Signed   By: Brandon Catalina M.D.   On: 05/01/2020 11:57   DG Retrograde Pyelogram  Result Date: 05/01/2020 CLINICAL DATA:  Left hydronephrosis and distal ureteral calculus. EXAM: INTRAOPERATIVE LEFT RETROGRADE UROGRAPHY TECHNIQUE: Images were obtained with the C-arm fluoroscopic device intraoperatively and submitted for interpretation post-operatively. Please see the procedural report for the amount of contrast and the fluoroscopy time utilized. COMPARISON:  CT of the abdomen and pelvis without contrast earlier today. FINDINGS: Intraoperative images were obtained which demonstrate placement of a left ureteral stent which extends from the lower pole collecting system to the bladder. There is faint contrast opacification the left renal pelvis and part of the lower left renal collecting system. IMPRESSION: Left ureteral stent placement. Electronically Signed   By: Aletta Edouard M.D.   On: 05/01/2020 16:03   DG Chest Port 1 View  Result Date: 05/01/2020 CLINICAL DATA:  Cough and fevers EXAM: PORTABLE CHEST 1 VIEW COMPARISON:  None. FINDINGS: Cardiac shadow is within normal limits. The lungs are well aerated without focal infiltrate. No bony abnormality is seen. IMPRESSION:  No active disease. Electronically Signed   By: Brandon Catalina M.D.   On: 05/01/2020 10:40   CT Renal Stone Study  Result Date: 05/01/2020 CLINICAL DATA:  Flank pain EXAM: CT ABDOMEN AND PELVIS WITHOUT CONTRAST TECHNIQUE: Multidetector CT imaging of the abdomen and pelvis was performed following the standard protocol without IV contrast. COMPARISON:  None. FINDINGS: Lower chest: Minimal atelectatic changes are noted in the right base. Hepatobiliary: No focal liver abnormality is seen. No gallstones, gallbladder wall thickening, or biliary dilatation. Pancreas: Unremarkable. No pancreatic ductal dilatation or surrounding inflammatory changes. Spleen: Normal in size without focal abnormality. Adrenals/Urinary Tract: Adrenal glands are within normal limits. Kidneys demonstrate bilateral hydronephrotic changes slightly worse on the left than the right. No renal calculi are seen. There is however a distal left ureteral stone which measures almost 7 mm in greatest dimension. No ureteral stone is noted on the right. The bladder is partially distended. Stomach/Bowel: Mild diverticular changes noted without evidence of diverticulitis. The appendix is  within normal limits. No small bowel or gastric abnormality is seen. Vascular/Lymphatic: Left retroaortic renal vein is noted. No enlarged abdominal or pelvic lymph nodes. Reproductive: Prostate is unremarkable. Other: No abdominal wall hernia or abnormality. No abdominopelvic ascites. Musculoskeletal: No acute or significant osseous findings. IMPRESSION: Bilateral hydronephrosis left greater than right with evidence of a 7 mm distal left ureteral stone. No obstructing ureteral stone on the right is seen. Mild diverticular change without diverticulitis. Electronically Signed   By: Brandon Catalina M.D.   On: 05/01/2020 11:52     Assessment and Plan: Sepsis with retention and left distal ureteral stone doing well s/p foley and left stent.  1.  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  He is concerned about the sexual side effects of the TURP.   I will start him on tamsulosin and finasteride and reviewed the side effects of those meds which can impact sexual desire and function but are reversible.  I will have him come to the office for a voiding trial next week.  2. Left distal stone will need ureteroscopy in a week or two.  I will get that scheduled through my office.        LOS: 1 day    Irine Seal 05/02/2020 [phone] ID: Brandon Carpenter, male   DOB: 1960/11/19, 60 y.o.   MRN: [id number]

## 2020-05-02 NOTE — Progress Notes (Signed)
PHARMACY - PHYSICIAN COMMUNICATION CRITICAL VALUE ALERT - BLOOD CULTURE IDENTIFICATION (BCID)  Brandon Carpenter is an 60 y.o. male who presented to [facility] on 05/01/2020 with a chief complaint of sepsis/UTI  Assessment:  WBC up some, Tmax 103.7 (last temp 98.8)  Name of physician (or Provider) Contacted: Dr. Larae Grooms (FMTS)  Current antibiotics: Merrem  Changes to prescribed antibiotics recommended:  Cont Merrem (in setting of PCN allergy)  Results for orders placed or performed during the hospital encounter of 05/01/20  Blood Culture ID Panel (Reflexed) (Collected: 05/01/2020  9:09 AM)  Result Value Ref Range   Enterococcus faecalis NOT DETECTED NOT DETECTED   Enterococcus Faecium NOT DETECTED NOT DETECTED   Listeria monocytogenes NOT DETECTED NOT DETECTED   Staphylococcus species NOT DETECTED NOT DETECTED   Staphylococcus aureus (BCID) NOT DETECTED NOT DETECTED   Staphylococcus epidermidis NOT DETECTED NOT DETECTED   Staphylococcus lugdunensis NOT DETECTED NOT DETECTED   Streptococcus species NOT DETECTED NOT DETECTED   Streptococcus agalactiae NOT DETECTED NOT DETECTED   Streptococcus pneumoniae NOT DETECTED NOT DETECTED   Streptococcus pyogenes NOT DETECTED NOT DETECTED   A.calcoaceticus-baumannii NOT DETECTED NOT DETECTED   Bacteroides fragilis NOT DETECTED NOT DETECTED   Enterobacterales DETECTED (A) NOT DETECTED   Enterobacter cloacae complex NOT DETECTED NOT DETECTED   Escherichia coli DETECTED (A) NOT DETECTED   Klebsiella aerogenes NOT DETECTED NOT DETECTED   Klebsiella oxytoca NOT DETECTED NOT DETECTED   Klebsiella pneumoniae NOT DETECTED NOT DETECTED   Proteus species NOT DETECTED NOT DETECTED   Salmonella species NOT DETECTED NOT DETECTED   Serratia marcescens NOT DETECTED NOT DETECTED   Haemophilus influenzae NOT DETECTED NOT DETECTED   Neisseria meningitidis NOT DETECTED NOT DETECTED   Pseudomonas aeruginosa NOT DETECTED NOT DETECTED   Stenotrophomonas  maltophilia NOT DETECTED NOT DETECTED   Candida albicans NOT DETECTED NOT DETECTED   Candida auris NOT DETECTED NOT DETECTED   Candida glabrata NOT DETECTED NOT DETECTED   Candida krusei NOT DETECTED NOT DETECTED   Candida parapsilosis NOT DETECTED NOT DETECTED   Candida tropicalis NOT DETECTED NOT DETECTED   Cryptococcus neoformans/gattii NOT DETECTED NOT DETECTED   CTX-M ESBL NOT DETECTED NOT DETECTED   Carbapenem resistance IMP NOT DETECTED NOT DETECTED   Carbapenem resistance KPC NOT DETECTED NOT DETECTED   Carbapenem resistance NDM NOT DETECTED NOT DETECTED   Carbapenem resist OXA 48 LIKE NOT DETECTED NOT DETECTED   Carbapenem resistance VIM NOT DETECTED NOT DETECTED    [name] 05/02/2020  5:35 AM

## 2020-05-02 NOTE — Progress Notes (Signed)
Nutrition Brief Note  Consult received for low albumin.   Spoke with pt and his wife. They report that pt has had no weight loss PTA. Usual weight is 165 lb. Appetite good. Pt reports he eats 3 meals per day but often goes too long between breakfast and lunch. But is agreeable to eating a snack or eating lunch earlier.    Likely albumin is low due to acute inflammatory process.   Albumin has a half-life of 21 days and is strongly affected by stress response and inflammatory process, therefore, do not expect to see an improvement in this lab value during acute hospitalization.   Wt Readings from Last 15 Encounters:  05/01/20 74.8 kg    Body mass index is 21.77 kg/m.   Current diet order is Regular, patient is consuming approximately 100% of meals at this time. Labs and medications reviewed.   No nutrition interventions warranted at this time. If nutrition issues arise, please consult RD.   Lockie Pares., RD, LDN, CNSC See AMiON for contact information

## 2020-05-03 ENCOUNTER — Other Ambulatory Visit: Payer: Self-pay | Admitting: Family Medicine

## 2020-05-03 ENCOUNTER — Other Ambulatory Visit: Payer: Self-pay | Admitting: Urology

## 2020-05-03 LAB — CBC
HCT: 35.1 % — ABNORMAL LOW (ref 39.0–52.0)
Hemoglobin: 12.2 g/dL — ABNORMAL LOW (ref 13.0–17.0)
MCH: 31.4 pg (ref 26.0–34.0)
MCHC: 34.8 g/dL (ref 30.0–36.0)
MCV: 90.2 fL (ref 80.0–100.0)
Platelets: 175 10*3/uL (ref 150–400)
RBC: 3.89 MIL/uL — ABNORMAL LOW (ref 4.22–5.81)
RDW: 13.4 % (ref 11.5–15.5)
WBC: 9.4 10*3/uL (ref 4.0–10.5)
nRBC: 0 % (ref 0.0–0.2)

## 2020-05-03 LAB — CULTURE, BLOOD (ROUTINE X 2)

## 2020-05-03 LAB — COMPREHENSIVE METABOLIC PANEL
ALT: 15 U/L (ref 0–44)
AST: 49 U/L — ABNORMAL HIGH (ref 15–41)
Albumin: 2.5 g/dL — ABNORMAL LOW (ref 3.5–5.0)
Alkaline Phosphatase: 52 U/L (ref 38–126)
Anion gap: 8 (ref 5–15)
BUN: 18 mg/dL (ref 6–20)
CO2: 25 mmol/L (ref 22–32)
Calcium: 8.3 mg/dL — ABNORMAL LOW (ref 8.9–10.3)
Chloride: 107 mmol/L (ref 98–111)
Creatinine, Ser: 1.21 mg/dL (ref 0.61–1.24)
GFR, Estimated: >60 mL/min (ref >60)
Glucose, Bld: 140 mg/dL — ABNORMAL HIGH (ref 70–99)
Potassium: 3.7 mmol/L (ref 3.5–5.1)
Sodium: 140 mmol/L (ref 135–145)
Total Bilirubin: 0.7 mg/dL (ref 0.3–1.2)
Total Protein: 5.5 g/dL — ABNORMAL LOW (ref 6.5–8.1)

## 2020-05-03 LAB — HEMOGLOBIN A1C
Hgb A1c MFr Bld: 6.2 % — ABNORMAL HIGH (ref 4.8–5.6)
Mean Plasma Glucose: 131.24 mg/dL

## 2020-05-03 LAB — HEPATITIS B CORE ANTIBODY, TOTAL: Hep B Core Total Ab: NONREACTIVE

## 2020-05-03 LAB — URINE CULTURE: Culture: >=100000 — AB

## 2020-05-03 LAB — HEPATITIS B SURFACE ANTIGEN: Hepatitis B Surface Ag: NONREACTIVE

## 2020-05-03 MED ORDER — TAMSULOSIN HCL 0.4 MG PO CAPS: 0.4000 mg | ORAL_CAPSULE | Freq: Every day | ORAL | 0 refills | Status: DC

## 2020-05-03 MED ORDER — CEPHALEXIN 500 MG PO CAPS
500.0000 mg | ORAL_CAPSULE | Freq: Four times a day (QID) | ORAL | Status: DC
  Administered 2020-05-03: 500 mg via ORAL
  Filled 2020-05-03: qty 1

## 2020-05-03 MED ORDER — FINASTERIDE 5 MG PO TABS: 5.0000 mg | ORAL_TABLET | Freq: Every day | ORAL | 0 refills | Status: DC

## 2020-05-03 MED ORDER — ACETAMINOPHEN 325 MG PO TABS: 650.0000 mg | ORAL_TABLET | Freq: Four times a day (QID) | ORAL | 0 refills | Status: AC | PRN

## 2020-05-03 MED ORDER — CEPHALEXIN 500 MG PO CAPS: 500.0000 mg | ORAL_CAPSULE | Freq: Four times a day (QID) | ORAL | 0 refills | Status: AC

## 2020-05-03 MED FILL — TAMSULOSIN HCL 0.4 MG CAP: 0.4 | 30 days supply | Qty: 30 | Fill #0

## 2020-05-03 MED FILL — CEPHALEXIN 500 MG CAPS: 500 | 12 days supply | Qty: 48 | Fill #0

## 2020-05-03 MED FILL — FINASTERIDE 5 MG TABLET: 5 | 30 days supply | Qty: 30 | Fill #0

## 2020-05-03 NOTE — Progress Notes (Signed)
Pt stated there were some drops of blood going into his Foley every time he felt the urge to pee. Notified Dr. Keene Breath, to confirm that it is expected and pt is clear for d/c.  Pt also concerned about pain levels at night. He would like something stronger than Tylenol, if possible, for nighttime.

## 2020-05-03 NOTE — Discharge Instructions (Signed)
Indwelling Urinary Catheter Care, Adult °An indwelling urinary catheter is a thin tube that is put into your bladder. The tube helps to drain pee (urine) out of your body. The tube goes in through your urethra. Your urethra is where pee comes out of your body. Your pee will come out through the catheter, then it will go into a bag (drainage bag). °Take good care of your catheter so it will work well. °How to wear your catheter and bag °Supplies needed °· Sticky tape (adhesive tape) or a leg strap. °· Alcohol wipe or soap and water (if you use tape). °· A clean towel (if you use tape). °· Large overnight bag. °· Smaller bag (leg bag). °Wearing your catheter °Attach your catheter to your leg with tape or a leg strap. °· Make sure the catheter is not pulled tight. °· If a leg strap gets wet, take it off and put on a dry strap. °· If you use tape to hold the bag on your leg: °1. Use an alcohol wipe or soap and water to wash your skin where the tape made it sticky before. °2. Use a clean towel to pat-dry that skin. °3. Use new tape to make the bag stay on your leg. °Wearing your bags °You should have been given a large overnight bag. °· You may wear the overnight bag in the day or night. °· Always have the overnight bag lower than your bladder.  Do not let the bag touch the floor. °· Before you go to sleep, put a clean plastic bag in a wastebasket. Then hang the overnight bag inside the wastebasket. °You should also have a smaller leg bag that fits under your clothes. °· Always wear the leg bag below your knee. °· Do not wear your leg bag at night. °How to care for your skin and catheter °Supplies needed °· A clean washcloth. °· Water and mild soap. °· A clean towel. °Caring for your skin and catheter °· Clean the skin around your catheter every day: °1. Wash your hands with soap and water. °2. Wet a clean washcloth in warm water and mild soap. °3. Clean the skin around your urethra. °§ If you are male: °§ Gently  spread the folds of skin around your vagina (labia). °§ With the washcloth in your other hand, wipe the inner side of your labia on each side. Wipe from front to back. °§ If you are male: °§ Pull back any skin that covers the end of your penis (foreskin). °§ With the washcloth in your other hand, wipe your penis in small circles. Start wiping at the tip of your penis, then move away from the catheter. °§ Move the foreskin back in place, if needed. °4. With your free hand, hold the catheter close to where it goes into your body. °§ Keep holding the catheter during cleaning so it does not get pulled out. °5. With the washcloth in your other hand, clean the catheter. °§ Only wipe downward on the catheter. °§ Do not wipe upward toward your body. Doing this may push germs into your urethra and cause infection. °6. Use a clean towel to pat-dry the catheter and the skin around it. Make sure to wipe off all soap. °7. Wash your hands with soap and water. °· Shower every day. Do not take baths. °· Do not use cream, ointment, or lotion on the area where the catheter goes into your body, unless your doctor tells you to. °· Do not   use powders, sprays, or lotions on your genital area. °· Check your skin around the catheter every day for signs of infection. Check for: °? Redness, swelling, or pain. °? Fluid or blood. °? Warmth. °? Pus or a bad smell.  °  °  °How to empty the bag °Supplies needed °· Rubbing alcohol. °· Gauze pad or cotton ball. °· Tape or a leg strap. °Emptying the bag °Pour the pee out of your bag when it is ?-½ full, or at least 2-3 times a day. Do this for your overnight bag and your leg bag. °1. Wash your hands with soap and water. °2. Separate (detach) the bag from your leg. °3. Hold the bag over the toilet or a clean pail. Keep the bag lower than your hips and bladder. This is so the pee (urine) does not go back into the tube. °4. Open the pour spout. It is at the bottom of the bag. °5. Empty the pee into the  toilet or pail. Do not let the pour spout touch any surface. °6. Put rubbing alcohol on a gauze pad or cotton ball. °7. Use the gauze pad or cotton ball to clean the pour spout. °8. Close the pour spout. °9. Attach the bag to your leg with tape or a leg strap. °10. Wash your hands with soap and water. °Follow instructions for cleaning the drainage bag: °· From the product maker. °· As told by your doctor. °How to change the bag °Supplies needed °· Alcohol wipes. °· A clean bag. °· Tape or a leg strap. °Changing the bag °Replace your bag when it starts to leak, smell bad, or look dirty. °1. Wash your hands with soap and water. °2. Separate the dirty bag from your leg. °3. Pinch the catheter with your fingers so that pee does not spill out. °4. Separate the catheter tube from the bag tube where these tubes connect (at the connection valve). Do not let the tubes touch any surface. °5. Clean the end of the catheter tube with an alcohol wipe. Use a different alcohol wipe to clean the end of the bag tube. °6. Connect the catheter tube to the tube of the clean bag. °7. Attach the clean bag to your leg with tape or a leg strap. Do not make the bag tight on your leg. °8. Wash your hands with soap and water. °General rules °· Never pull on your catheter. Never try to take it out. Doing that can hurt you. °· Always wash your hands before and after you touch your catheter or bag. Use a mild, fragrance-free soap. If you do not have soap and water, use hand sanitizer. °· Always make sure there are no twists or bends (kinks) in the catheter tube. °· Always make sure there are no leaks in the catheter or bag. °· Drink enough fluid to keep your pee pale yellow. °· Do not take baths, swim, or use a hot tub. °· If you are male, wipe from front to back after you poop (have a bowel movement).   °Contact a doctor if: °· Your pee is cloudy. °· Your pee smells worse than usual. °· Your catheter gets clogged. °· Your catheter  leaks. °· Your bladder feels full. °Get help right away if: °· You have redness, swelling, or pain where the catheter goes into your body. °· You have fluid, blood, pus, or a bad smell coming from the area where the catheter goes into your body. °· Your skin feels   warm where the catheter goes into your body.  You have a fever.  You have pain in your: ? Belly (abdomen). ? Legs. ? Lower back. ? Bladder.  You see blood in the catheter.  Your pee is pink or red.  You feel sick to your stomach (nauseous).  You throw up (vomit).  You have chills.  Your pee is not draining into the bag.  Your catheter gets pulled out. Summary  An indwelling urinary catheter is a thin tube that is placed into the bladder to help drain pee (urine) out of the body.  The catheter is placed into the part of the body that drains pee from the bladder (urethra).  Taking good care of your catheter will keep it working properly and help prevent problems.  Always wash your hands before and after touching your catheter or bag.  Never pull on your catheter or try to take it out. This information is not intended to replace advice given to you by your health care provider. Make sure you discuss any questions you have with your health care provider. Document Revised: 06/12/2018 Document Reviewed: 10/04/2016 Elsevier Patient Education  2021 [place]. Ureteral Stent Implantation, Care After This sheet gives you information about how to care for yourself after your procedure. Your health care provider may also give you more specific instructions. If you have problems or questions, contact your health care provider. What can I expect after the procedure? After the procedure, it is common to have:  Nausea.  Mild pain when you urinate. You may feel this pain in your lower back or lower abdomen. The pain should stop within a few minutes after you urinate. This may last for up to 1 week.  A small amount of blood  in your urine for several days. Follow these instructions at home: Medicines  Take over-the-counter and prescription medicines only as told by your health care provider.  If you were prescribed an antibiotic medicine, take it as told by your health care provider. Do not stop taking the antibiotic even if you start to feel better.  Do not drive for 24 hours if you were given a sedative during your procedure.  Ask your health care provider if the medicine prescribed to you requires you to avoid driving or using heavy machinery. Activity  Rest as told by your health care provider.  Avoid sitting for a long time without moving. Get up to take short walks every 1-2 hours. This is important to improve blood flow and breathing. Ask for help if you feel weak or unsteady.  Return to your normal activities as told by your health care provider. Ask your health care provider what activities are safe for you. General instructions  Watch for any blood in your urine. Call your health care provider if the amount of blood in your urine increases.  If you have a catheter: ? Follow instructions from your health care provider about taking care of your catheter and collection bag. ? Do not take baths, swim, or use a hot tub until your health care provider approves. Ask your health care provider if you may take showers. You may only be allowed to take sponge baths.  Drink enough fluid to keep your urine pale yellow.  Do not use any products that contain nicotine or tobacco, such as cigarettes, e-cigarettes, and chewing tobacco. These can delay healing after surgery. If you need help quitting, ask your health care provider.  Keep all follow-up visits as told  by your health care provider. This is important.   Contact a health care provider if:  You have pain that gets worse or does not get better with medicine, especially pain when you urinate.  You have difficulty urinating.  You feel nauseous or you  vomit repeatedly during a period of more than 2 days after the procedure. Get help right away if:  Your urine is dark red or has blood clots in it.  You are leaking urine (have incontinence).  The end of the stent comes out of your urethra.  You cannot urinate.  You have sudden, sharp, or severe pain in your abdomen or lower back.  You have a fever.  You have swelling or pain in your legs.  You have difficulty breathing. Summary  After the procedure, it is common to have mild pain when you urinate that goes away within a few minutes after you urinate. This may last for up to 1 week.  Watch for any blood in your urine. Call your health care provider if the amount of blood in your urine increases.  Take over-the-counter and prescription medicines only as told by your health care provider.  Drink enough fluid to keep your urine pale yellow. This information is not intended to replace advice given to you by your health care provider. Make sure you discuss any questions you have with your health care provider. Document Revised: 11/25/2017 Document Reviewed: 11/26/2017 Elsevier Patient Education  2021 [place] were hospitalized due to severe infection secondary to a urinary tract infection that also reached your blood (bacteremia). Your symptoms improved after placement of a ureteral stent and initiation of antibiotics. You will continue on antibiotics until 05/12/2020 and will follow-up with urology outpatient as well as needing establishment with a primary care physician. We have set up an appointment with our clinic to do a hospital follow-up, if you would like to establish care at our clinic we would love to take care of you, you would just need to let the office know during your visit. You were also found to have Pre-Diabetes, which we want to avoid progression to diabetes. It is important to work on dietary modifications to avoid further progression and will need  monitoring with a primary care doctor.        Diabetes Care, 44(Suppl 1), W29-H37. https://doi.org/https://doi.org/10.2337/dc21-[email or web address]">  Prediabetes Prediabetes is when your blood sugar (blood glucose) level is higher than normal but not high enough for you to be diagnosed with type 2 diabetes. Having prediabetes puts you at risk for developing type 2 diabetes (type 2 diabetes mellitus). With certain lifestyle changes, you may be able to prevent or delay the onset of type 2 diabetes. This is important because type 2 diabetes can lead to serious complications, such as:  Heart disease.  Stroke.  Blindness.  Kidney disease.  Depression.  Poor circulation in the feet and legs. In severe cases, this could lead to surgical removal of a leg (amputation). What are the causes? The exact cause of prediabetes is not known. It may result from insulin resistance. Insulin resistance develops when cells in the body do not respond properly to insulin that the body makes. This can cause excess glucose to build up in the blood. High blood glucose (hyperglycemia) can develop. What increases the risk? The following factors may make you more likely to develop this condition:  You have a family member with type 2 diabetes.  You are older  than 45 years.  You had a temporary form of diabetes during a pregnancy (gestational diabetes).  You had polycystic ovary syndrome (PCOS).  You are overweight or obese.  You are inactive (sedentary).  You have a history of heart disease, including problems with cholesterol levels, high levels of blood fats, or high blood pressure. What are the signs or symptoms? You may have no symptoms. If you do have symptoms, they may include:  Increased hunger.  Increased thirst.  Increased urination.  Vision changes, such as blurry vision.  Tiredness (fatigue). How is this diagnosed? This condition can be diagnosed with blood tests. Your blood glucose may be  checked with one or more of the following tests:  A fasting blood glucose (FBG) test. You will not be allowed to eat (you will fast) for at least 8 hours before a blood sample is taken.  An A1C blood test (hemoglobin A1C). This test provides information about blood glucose levels over the previous 2?3 months.  An oral glucose tolerance test (OGTT). This test measures your blood glucose at two points in time: ? After fasting. This is your baseline level. ? Two hours after you drink a beverage that contains glucose. You may be diagnosed with prediabetes if:  Your FBG is 100?125 mg/dL (5.6-6.9 mmol/L).  Your A1C level is 5.7?6.4% (39-46 mmol/mol).  Your OGTT result is 140?199 mg/dL (7.8-11 mmol/L). These blood tests may be repeated to confirm your diagnosis.   How is this treated? Treatment may include dietary and lifestyle changes to help lower your blood glucose and prevent type 2 diabetes from developing. In some cases, medicine may be prescribed to help lower the risk of type 2 diabetes. Follow these instructions at home: Nutrition  Follow a healthy meal plan. This includes eating lean proteins, whole grains, legumes, fresh fruits and vegetables, low-fat dairy products, and healthy fats.  Follow instructions from your health care provider about eating or drinking restrictions.  Meet with a dietitian to create a healthy eating plan that is right for you.   Lifestyle  Do moderate-intensity exercise for at least 30 minutes a day on 5 or more days each week, or as told by your health care provider. A mix of activities may be best, such as: ? Brisk walking, swimming, biking, and weight lifting.  Lose weight as told by your health care provider. Losing 5-7% of your body weight can reverse insulin resistance.  Do not drink alcohol if: ? Your health care provider tells you not to drink. ? You are pregnant, may be pregnant, or are planning to become pregnant.  If you drink  alcohol: ? Limit how much you use to:  0-1 drink a day for women.  0-2 drinks a day for men. ? Be aware of how much alcohol is in your drink. In the [place] one drink equals one 12 oz bottle of beer (355 mL), one 5 oz glass of wine (148 mL), or one 1 oz glass of hard liquor (44 mL). General instructions  Take over-the-counter and prescription medicines only as told by your health care provider. You may be prescribed medicines that help lower the risk of type 2 diabetes.  Do not use any products that contain nicotine or tobacco, such as cigarettes, e-cigarettes, and chewing tobacco. If you need help quitting, ask your health care provider.  Keep all follow-up visits. This is important. Where to find more information  American Diabetes Association: www.diabetes.org  Academy of Nutrition and Dietetics: www.eatright.org  American Heart  Association: www.heart.org Contact a health care provider if:  You have any of these symptoms: ? Increased hunger. ? Increased urination. ? Increased thirst. ? Fatigue. ? Vision changes, such as blurry vision. Get help right away if you:  Have shortness of breath.  Feel confused.  Vomit or feel like you may vomit. Summary  Prediabetes is when your blood sugar (blood glucose)level is higher than normal but not high enough for you to be diagnosed with type 2 diabetes.  Having prediabetes puts you at risk for developing type 2 diabetes (type 2 diabetes mellitus).  Make lifestyle changes such as eating a healthy diet and exercising regularly to help prevent diabetes. Lose weight as told by your health care provider. This information is not intended to replace advice given to you by your health care provider. Make sure you discuss any questions you have with your health care provider. Document Revised: 05/20/2019 Document Reviewed: 05/20/2019 Elsevier Patient Education  2021 [place].    Prediabetes Eating Plan Prediabetes is a condition  that causes blood sugar (glucose) levels to be higher than normal. This increases the risk for developing type 2 diabetes (type 2 diabetes mellitus). Working with a health care provider or nutrition specialist (dietitian) to make diet and lifestyle changes can help prevent the onset of diabetes. These changes may help you:  Control your blood glucose levels.  Improve your cholesterol levels.  Manage your blood pressure. What are tips for following this plan? Reading food labels  Read food labels to check the amount of fat, salt (sodium), and sugar in prepackaged foods. Avoid foods that have: ? Saturated fats. ? Trans fats. ? Added sugars.  Avoid foods that have more than 300 milligrams (mg) of sodium per serving. Limit your sodium intake to less than 2,300 mg each day. Shopping  Avoid buying pre-made and processed foods.  Avoid buying drinks with added sugar. Cooking  Cook with olive oil. Do not use butter, lard, or ghee.  Bake, broil, grill, steam, or boil foods. Avoid frying. Meal planning  Work with your dietitian to create an eating plan that is right for you. This may include tracking how many calories you take in each day. Use a food diary, notebook, or mobile application to track what you eat at each meal.  Consider following a Mediterranean diet. This includes: ? Eating several servings of fresh fruits and vegetables each day. ? Eating fish at least twice a week. ? Eating one serving each day of whole grains, beans, nuts, and seeds. ? Using olive oil instead of other fats. ? Limiting alcohol. ? Limiting red meat. ? Using nonfat or low-fat dairy products.  Consider following a plant-based diet. This includes dietary choices that focus on eating mostly vegetables and fruit, grains, beans, nuts, and seeds.  If you have high blood pressure, you may need to limit your sodium intake or follow a diet such as the DASH (Dietary Approaches to Stop Hypertension) eating plan. The  DASH diet aims to lower high blood pressure.   Lifestyle  Set weight loss goals with help from your health care team. It is recommended that most people with prediabetes lose 7% of their body weight.  Exercise for at least 30 minutes 5 or more days a week.  Attend a support group or seek support from a mental health counselor.  Take over-the-counter and prescription medicines only as told by your health care provider. What foods are recommended? Fruits Berries. Bananas. Apples. Oranges. Grapes. Papaya. Mango. Pomegranate.  Kiwi. Grapefruit. Cherries. Vegetables Lettuce. Spinach. Peas. Beets. Cauliflower. Cabbage. Broccoli. Carrots. Tomatoes. Squash. Eggplant. Herbs. Peppers. Onions. Cucumbers. Brussels sprouts. Grains Whole grains, such as whole-wheat or whole-grain breads, crackers, cereals, and pasta. Unsweetened oatmeal. Bulgur. Barley. Quinoa. Brown rice. Corn or whole-wheat flour tortillas or taco shells. Meats and other proteins Seafood. Poultry without skin. Lean cuts of pork and beef. Tofu. Eggs. Nuts. Beans. Dairy Low-fat or fat-free dairy products, such as yogurt, cottage cheese, and cheese. Beverages Water. Tea. Coffee. Sugar-free or diet soda. Seltzer water. Low-fat or nonfat milk. Milk alternatives, such as soy or almond milk. Fats and oils Olive oil. Canola oil. Sunflower oil. Grapeseed oil. Avocado. Walnuts. Sweets and desserts Sugar-free or low-fat pudding. Sugar-free or low-fat ice cream and other frozen treats. Seasonings and condiments Herbs. Sodium-free spices. Mustard. Relish. Low-salt, low-sugar ketchup. Low-salt, low-sugar barbecue sauce. Low-fat or fat-free mayonnaise. The items listed above may not be a complete list of recommended foods and beverages. Contact a dietitian for more information. What foods are not recommended? Fruits Fruits canned with syrup. Vegetables Canned vegetables. Frozen vegetables with butter or cream sauce. Grains Refined white  flour and flour products, such as bread, pasta, snack foods, and cereals. Meats and other proteins Fatty cuts of meat. Poultry with skin. Breaded or fried meat. Processed meats. Dairy Full-fat yogurt, cheese, or milk. Beverages Sweetened drinks, such as iced tea and soda. Fats and oils Butter. Lard. Ghee. Sweets and desserts Baked goods, such as cake, cupcakes, pastries, cookies, and cheesecake. Seasonings and condiments Spice mixes with added salt. Ketchup. Barbecue sauce. Mayonnaise. The items listed above may not be a complete list of foods and beverages that are not recommended. Contact a dietitian for more information. Where to find more information  American Diabetes Association: www.diabetes.org Summary  You may need to make diet and lifestyle changes to help prevent the onset of diabetes. These changes can help you control blood sugar, improve cholesterol levels, and manage blood pressure.  Set weight loss goals with help from your health care team. It is recommended that most people with prediabetes lose 7% of their body weight.  Consider following a Mediterranean diet. This includes eating plenty of fresh fruits and vegetables, whole grains, beans, nuts, seeds, fish, and low-fat dairy, and using olive oil instead of other fats. This information is not intended to replace advice given to you by your health care provider. Make sure you discuss any questions you have with your health care provider. Document Revised: 05/20/2019 Document Reviewed: 05/20/2019 Elsevier Patient Education  [place].

## 2020-05-03 NOTE — Progress Notes (Signed)
Synetta Shadow Burkhammer to be D/C'd  per MD order. Discussed with the patient and all questions fully answered.  VSS, Skin clean, dry and intact without evidence of skin break down, no evidence of skin tears noted.  IV catheter discontinued intact. Site without signs and symptoms of complications. Dressing and pressure applied.  An After Visit Summary was printed and given to the patient. Patient received prescription for Tylenol to take to a pharmacy of choice. Pt received other medications from [place], wife paid bedside.  D/c education completed with patient/family including follow up instructions, medication list, d/c activities limitations if indicated, with other d/c instructions as indicated by MD - patient able to verbalize understanding, all questions fully answered.  Changed out Foley standard drainage bag to a leg bag. Completed teaching with teachback on Foley Care, and printed instructions are in the AVS document.  Patient instructed to return to ED, call 911, or call MD for any changes in condition.   Patient to be escorted via [place], and D/C home via private auto.

## 2020-05-03 NOTE — Discharge Summary (Signed)
[facility] Discharge Summary  Patient name: Brandon Carpenter Medical record number: [id number] Date of birth: Dec 12, 1960 Age: 60 y.o. Gender: male Date of Admission: 05/01/2020  Date of Discharge: 05/03/2020  Admitting Physician: Blane Ohara McDiarmid, MD  Primary Care Provider: Patient, No Pcp Per Consultants: Urology  Indication for Hospitalization: Difficulty urinating  Discharge Diagnoses/Problem List:  Patient Active Problem List   Diagnosis Date Noted  . Urinary retention 05/02/2020  . Ureteral stone with hydronephrosis   . E. coli sepsis ([place]) 05/01/2020  . Personal history of COVID-19 03/23/2020    Disposition: Home  Discharge Condition: Stable, improved  Discharge Exam:  Physical Exam: General: NAD, sitting in chair next to bed Cardiovascular: RRR, no peripheral edema noted Respiratory: comfortable on room air, no increased WOB Abdomen: soft, non-tender, non-distended Extremities: no peripheral edema noted, moving all extremities equally and appropriately  Brief Hospital Course:  Brandon Carpenter is a 60 y.o. male presenting with urinary difficulties, shakiness, fever, cough, and headache. No significant PMH.  Urosepsis 2/2 complicated UTI  AKI In ED, patient febrile to 103.7, tachycardic to 120's and tachypnic to 28. Lactic acid 2.6>1.0 after fluids. UA significant for moderate Hgb, large leuks, 30 protein, 50 glucose. Urine micro demonstrated many bacteria, >50 WBC. Started on 200 mg cipro in ED x1, started on Meropenem on the floor and then transitioned to CTX once culture returned as e.coli, until sensitivities resulted. CT renal significant for b/l hydronephrosis (L>R) with 7 mm left distal ureteral stone. Urology consulted, cystoscopy with b/l RTG's and left ureteral stent placed on 2/28. Creatinine 1.42 on admission, resolved after intervention. Urine culture returned with e. Coli that was pansensitive.  Patient was transitioned to oral  Keflex to complete 14 day course. Patient will need ureteroscopy for left distal stone with Urology in 1-2 weeks following d/c.   BPH Patient started on tamsulosin and finasteride. At this time, patient not interested in TURP and will follow up with urology on the outpatient setting.  Elevated Troponin Troponin's initially up-trending and then flattened. [phone]. Thought to be related to demand ischemia. Cardiology consulted by ED and declined intervention. Echo performed prior to OR and showed LV EF of 50-55%. No regional wall abnormalities.   Pre-diabetes Patient hyperglycemic on admission, A1c was 6.2. Patient education was started regarding dietary changes, to be continued in the outpatient setting. Patient is very engaged and would like further education.   Issues for follow-up: 1. FPt discharged with catheter. Follow up with Urology with voiding trial. 2. Consider ultrasound outpatient for hypodense lesion in left lobe of liver 3. PCP follow-up for further pre-diabetic dietary education, A1c was 6.2.   Significant Procedures: cystoscopy with ureteral stent placement  Significant Labs and Imaging:  Recent Labs  Lab 05/01/20 0909 05/02/20 0053 05/03/20 0141  WBC 10.4 13.5* 9.4  HGB 13.6 12.4* 12.2*  HCT 41.4 36.7* 35.1*  PLT 143* 153 175   Recent Labs  Lab 05/01/20 0909 05/01/20 1813 05/02/20 0053 05/03/20 0611  NA 132*  --  137 140  K 3.8  --  4.0 3.7  CL 101  --  105 107  CO2 20*  --  25 25  GLUCOSE 198*  --  165* 140*  BUN 16  --  16 18  CREATININE 1.42*  --  1.34* 1.21  CALCIUM 8.2*  --  8.1* 8.3*  MG  --  1.8 2.0  --   ALKPHOS 79  --  58 52  AST 57*  --  55* 49*  ALT 18  --  16 15  ALBUMIN 3.1*  --  2.7* 2.5*    CT Angio Chest PE W and/or Wo Contrast  Result Date: 05/01/2020 CLINICAL DATA:  Shortness of breath and nonproductive cough EXAM: CT ANGIOGRAPHY CHEST WITH CONTRAST TECHNIQUE: Multidetector CT imaging of the chest was performed using the  standard protocol during bolus administration of intravenous contrast. Multiplanar CT image reconstructions and MIPs were obtained to evaluate the vascular anatomy. CONTRAST:  45mL OMNIPAQUE IOHEXOL 350 MG/ML SOLN COMPARISON:  Chest x-ray from earlier in the same day. FINDINGS: Cardiovascular: Thoracic aorta and its branches are within normal limits. No significant coronary calcifications are noted. No cardiac enlargement is seen. No pericardial effusion is seen. The pulmonary artery is well visualized within normal branching pattern bilaterally. No filling defects to suggest pulmonary emboli are identified. Mediastinum/Nodes: Thoracic inlet is within normal limits. No sizable hilar or mediastinal adenopathy is noted. Esophagus as visualized within normal limits. Lungs/Pleura: Minimal right basilar atelectasis is noted. No focal infiltrate or sizable effusion is seen. No sizable parenchymal nodule is noted. Upper Abdomen: Visualized upper abdomen demonstrates a hypodensity within the left lobe of the liver best seen image number 309 of series 7 likely representing a cyst but incompletely evaluated on this exam. It is better visualized on this exam than on the prior precontrast images. Musculoskeletal: Mild degenerative changes of the thoracic spine are noted. Review of the MIP images confirms the above findings. IMPRESSION: No evidence of pulmonary emboli. Mild right basilar atelectasis. Hypodense lesion in the left lobe of the liver better visualized than on the prior precontrast image likely representing a cyst but incompletely evaluated on this exam. Nonemergent ultrasound could be performed for clarification as clinically necessary. Electronically Signed   By: Inez Catalina M.D.   On: 05/01/2020 11:57   DG Retrograde Pyelogram  Result Date: 05/01/2020 CLINICAL DATA:  Left hydronephrosis and distal ureteral calculus. EXAM: INTRAOPERATIVE LEFT RETROGRADE UROGRAPHY TECHNIQUE: Images were obtained with the C-arm  fluoroscopic device intraoperatively and submitted for interpretation post-operatively. Please see the procedural report for the amount of contrast and the fluoroscopy time utilized. COMPARISON:  CT of the abdomen and pelvis without contrast earlier today. FINDINGS: Intraoperative images were obtained which demonstrate placement of a left ureteral stent which extends from the lower pole collecting system to the bladder. There is faint contrast opacification the left renal pelvis and part of the lower left renal collecting system. IMPRESSION: Left ureteral stent placement. Electronically Signed   By: Aletta Edouard M.D.   On: 05/01/2020 16:03   DG Chest Port 1 View  Result Date: 05/01/2020 CLINICAL DATA:  Cough and fevers EXAM: PORTABLE CHEST 1 VIEW COMPARISON:  None. FINDINGS: Cardiac shadow is within normal limits. The lungs are well aerated without focal infiltrate. No bony abnormality is seen. IMPRESSION: No active disease. Electronically Signed   By: Inez Catalina M.D.   On: 05/01/2020 10:40   ECHOCARDIOGRAM COMPLETE  Result Date: 05/02/2020    ECHOCARDIOGRAM REPORT   Patient Name:   Brandon Carpenter Date of Exam: 05/02/2020 Medical Rec #:  [id number]        Height:       73.0 in Accession #:    [id number]       Weight:       165.0 lb Date of Birth:  September 10, 1960         BSA:          1.983 m Patient Age:  59 years         BP:           110/78 mmHg Patient Gender: M                HR:           66 bpm. Exam Location:  Inpatient Procedure: 2D Echo, Cardiac Doppler and Color Doppler STAT ECHO Indications:   Elevated troponin  History:       Patient has no prior history of Echocardiogram examinations.                Sepsis.  Sonographer:   Clayton Lefort RDCS (AE) Referring      [place] Phys: IMPRESSIONS  1. Inferior basal hypokinesis . Left ventricular ejection fraction, by estimation, is 50 to 55%. The left ventricle has low normal function. The left ventricle has no regional wall motion abnormalities.  Left ventricular diastolic parameters were normal.  2. Right ventricular systolic function is normal. The right ventricular size is normal.  3. The mitral valve is normal in structure. Trivial mitral valve regurgitation. No evidence of mitral stenosis.  4. The aortic valve is tricuspid. Aortic valve regurgitation is not visualized. No aortic stenosis is present.  5. The inferior vena cava is normal in size with greater than 50% respiratory variability, suggesting right atrial pressure of 3 mmHg. FINDINGS  Left Ventricle: Inferior basal hypokinesis. Left ventricular ejection fraction, by estimation, is 50 to 55%. The left ventricle has low normal function. The left ventricle has no regional wall motion abnormalities. The left ventricular internal cavity size was normal in size. There is no left ventricular hypertrophy. Left ventricular diastolic parameters were normal. Right Ventricle: The right ventricular size is normal. No increase in right ventricular wall thickness. Right ventricular systolic function is normal. Left Atrium: Left atrial size was normal in size. Right Atrium: Right atrial size was not well visualized. Pericardium: There is no evidence of pericardial effusion. Mitral Valve: The mitral valve is normal in structure. Trivial mitral valve regurgitation. No evidence of mitral valve stenosis. MV peak gradient, 1.9 mmHg. The mean mitral valve gradient is 1.0 mmHg. Tricuspid Valve: The tricuspid valve is normal in structure. Tricuspid valve regurgitation is mild . No evidence of tricuspid stenosis. Aortic Valve: The aortic valve is tricuspid. Aortic valve regurgitation is not visualized. No aortic stenosis is present. Aortic valve mean gradient measures 4.0 mmHg. Aortic valve peak gradient measures 6.7 mmHg. Aortic valve area, by VTI measures 2.63 cm. Pulmonic Valve: The pulmonic valve was normal in structure. Pulmonic valve regurgitation is not visualized. No evidence of pulmonic stenosis. Aorta: The  aortic root is normal in size and structure. Venous: The inferior vena cava is normal in size with greater than 50% respiratory variability, suggesting right atrial pressure of 3 mmHg. IAS/Shunts: No atrial level shunt detected by color flow Doppler.  LEFT VENTRICLE PLAX 2D LVIDd:         4.70 cm  Diastology LVIDs:         3.30 cm  LV e' medial:    8.92 cm/s LV PW:         1.30 cm  LV E/e' medial:  6.6 LV IVS:        1.10 cm  LV e' lateral:   9.57 cm/s LVOT diam:     2.20 cm  LV E/e' lateral: 6.1 LV SV:         69 LV SV Index:   35 LVOT Area:  3.80 cm  RIGHT VENTRICLE             IVC RV Basal diam:  3.40 cm     IVC diam: 2.00 cm RV S prime:     10.70 cm/s TAPSE (M-mode): 2.6 cm LEFT ATRIUM             Index       RIGHT ATRIUM           Index LA diam:        3.10 cm 1.56 cm/m  RA Area:     14.10 cm LA Vol (A2C):   44.5 ml 22.44 ml/m RA Volume:   34.90 ml  17.60 ml/m LA Vol (A4C):   34.2 ml 17.25 ml/m LA Biplane Vol: 42.8 ml 21.59 ml/m  AORTIC VALVE AV Area (Vmax):    2.60 cm AV Area (Vmean):   2.64 cm AV Area (VTI):     2.63 cm AV Vmax:           129.00 cm/s AV Vmean:          91.600 cm/s AV VTI:            0.263 m AV Peak Grad:      6.7 mmHg AV Mean Grad:      4.0 mmHg LVOT Vmax:         88.10 cm/s LVOT Vmean:        63.600 cm/s LVOT VTI:          0.182 m LVOT/AV VTI ratio: 0.69  AORTA Ao Root diam: 3.50 cm Ao Asc diam:  3.00 cm MITRAL VALVE MV Area (PHT): 1.71 cm    SHUNTS MV Area VTI:   2.68 cm    Systemic VTI:  0.18 m MV Peak grad:  1.9 mmHg    Systemic Diam: 2.20 cm MV Mean grad:  1.0 mmHg MV Vmax:       0.68 m/s MV Vmean:      36.1 cm/s MV Decel Time: 443 msec MV E velocity: 58.80 cm/s MV A velocity: 32.70 cm/s MV E/A ratio:  1.80 Jenkins Rouge MD Electronically signed by Jenkins Rouge MD Signature Date/Time: 05/02/2020/8:31:53 AM    Final    CT Renal Stone Study  Result Date: 05/01/2020 CLINICAL DATA:  Flank pain EXAM: CT ABDOMEN AND PELVIS WITHOUT CONTRAST TECHNIQUE: Multidetector CT imaging of  the abdomen and pelvis was performed following the standard protocol without IV contrast. COMPARISON:  None. FINDINGS: Lower chest: Minimal atelectatic changes are noted in the right base. Hepatobiliary: No focal liver abnormality is seen. No gallstones, gallbladder wall thickening, or biliary dilatation. Pancreas: Unremarkable. No pancreatic ductal dilatation or surrounding inflammatory changes. Spleen: Normal in size without focal abnormality. Adrenals/Urinary Tract: Adrenal glands are within normal limits. Kidneys demonstrate bilateral hydronephrotic changes slightly worse on the left than the right. No renal calculi are seen. There is however a distal left ureteral stone which measures almost 7 mm in greatest dimension. No ureteral stone is noted on the right. The bladder is partially distended. Stomach/Bowel: Mild diverticular changes noted without evidence of diverticulitis. The appendix is within normal limits. No small bowel or gastric abnormality is seen. Vascular/Lymphatic: Left retroaortic renal vein is noted. No enlarged abdominal or pelvic lymph nodes. Reproductive: Prostate is unremarkable. Other: No abdominal wall hernia or abnormality. No abdominopelvic ascites. Musculoskeletal: No acute or significant osseous findings. IMPRESSION: Bilateral hydronephrosis left greater than right with evidence of a 7 mm distal left ureteral stone. No obstructing ureteral  stone on the right is seen. Mild diverticular change without diverticulitis. Electronically Signed   By: Inez Catalina M.D.   On: 05/01/2020 11:52   Results/Tests Pending at Time of Discharge: None   Discharge Medications:  Allergies as of 05/03/2020      Reactions   Penicillins Anaphylaxis, Swelling   Tolerates cephalosporins. Childhood allergy unknown to patient. 30 years ago face and throat swell 1 day after starting PCN.       Medication List    STOP taking these medications   AZO Urinary Pain Relief 95 MG tablet Generic drug:  phenazopyridine   ibuprofen 200 MG tablet Commonly known as: ADVIL   sulfamethoxazole-trimethoprim 800-160 MG tablet Commonly known as: BACTRIM DS     TAKE these medications   acetaminophen 325 MG tablet Commonly known as: TYLENOL Take 2 tablets (650 mg total) by mouth every 6 (six) hours as needed for mild pain.   cephALEXin 500 MG capsule Commonly known as: KEFLEX Take 1 capsule (500 mg total) by mouth every 6 (six) hours for 12 days.   finasteride 5 MG tablet Commonly known as: PROSCAR Take 1 tablet (5 mg total) by mouth daily. Start taking on: May 04, 2020   tamsulosin 0.4 MG Caps capsule Commonly known as: FLOMAX Take 1 capsule (0.4 mg total) by mouth daily. Start taking on: May 04, 2020            Durable Medical Equipment  (From admission, onward)         Start     Ordered   05/02/20 1612  For home use only DME Walker rolling  Once       Question Answer Comment  Walker: With [place] Wheels   Patient needs a walker to treat with the following condition Weakness      05/02/20 1611           Discharge Care Instructions  (From admission, onward)         Start     Ordered   05/03/20 0000  Discharge wound care:       Comments: Per urology instructions   05/03/20 1249          Discharge Instructions: Please refer to Patient Instructions section of EMR for full details.  Patient was counseled important signs and symptoms that should prompt return to medical care, changes in medications, dietary instructions, activity restrictions, and follow up appointments.   Follow-Up Appointments:  Follow-up Information    Irine Seal, MD.   Specialty: Urology Why: the office will call to arrange f/u.  Contact information: [place] [address] [phone]        [facility]. Go on 05/08/2020.   Specialty: Family Medicine Why: Go to your appointment at 9:30AM, please arrive at least 15 minutes early. Contact  information: [address] [id number] mc [address] [place] [address] [place], [place], DO 05/03/2020, 12:57 PM PGY-1, [place]

## 2020-05-03 NOTE — Progress Notes (Addendum)
Family Medicine Teaching Service Daily Progress Note Intern Pager: [phone]  Patient name: Brandon Carpenter Medical record number: [id number] Date of birth: [birth year] Age: 60 y.o. Gender: male  Primary Care Provider: Patient, No Pcp Per Consultants: Urology Code Status: Full  Pt Overview and Major Events to Date:  2/28 - Admitted, ureteral stent placed  Assessment and Plan: Brandon Carpenter is a 60 y.o. male presenting with urinary difficulties, shakiness, fever, cough, and headache. No significant PMH  Severe E. Coli sepsis, improving Overnight tachycardia and tachypnea improved, blood pressures normotensive. Patient currently afebrile. S/p ureteral stent and foley catheter placement on 2/28. Urine culture positive for E. Coli. S/p 1 dose of cipro on 2/28, Meropenem 2/28-3/1, CTX 3/1-current (antibiotic day 3/14); anticipating transition to oral medication (Keflex) today. Will continue to monitor blood cultures as they had to be re-inoculated for better growth.  - Continuous cardiac telemetry with pulse ox - Currently on CTX, anticipate transition to keflex upon discharge - Vitals per unit routine - Monitor for fever   BPH S/p ureteral stent on 2/28. Will follow-up with urology outpatient with voiding trial. Left distal stone will need ureteroscopy in next 1-2 weeks.  - Urology consulted, appreciate recs - Continue tamsulosin and finasteride per urology   Elevated liver enzymes AST 49. ALT 15, Alk phos 52. Negative hepatitis panel.  - monitor CMP  Prediabetes HbA1c 6.2, patient interested in dietary education. - Outpatient follow-up for further dietary education  Elevated Troponin, trended flat Troponin trended flat 68>373>397>365, likely demand ischemia. Echo EF 50-55% with no significant findings.  Thrombocytopenia, improved  FEN/GI: Regular PPx: Lovenox restarting today   Status is: Inpatient  Remains inpatient appropriate because:Ongoing diagnostic testing needed  not appropriate for outpatient work up and IV treatments appropriate due to intensity of illness or inability to take PO   Dispo: The patient is from: Home              Anticipated d/c is to: Home              Patient currently is not medically stable to d/c.   Difficult to place patient No    Subjective:  Patient reports that he is doing well and is not having any pain currently.   Objective: Temp:  [98.5 F (36.9 C)-99.7 F (37.6 C)] 98.5 F (36.9 C) (03/02 0511) Pulse Rate:  [63-79] 63 (03/02 0511) Resp:  [17-18] 18 (03/02 0511) BP: (100-124)/(69-77) 100/70 (03/02 0511) SpO2:  [99 %-100 %] 99 % (03/02 0511) Physical Exam: General: NAD, sitting in chair next to bed Cardiovascular: RRR, no peripheral edema noted Respiratory: comfortable on room air, no increased WOB Abdomen: soft, non-tender, non-distended Extremities: no peripheral edema noted, moving all extremities equally and appropriately  Laboratory: Recent Labs  Lab 05/01/20 0909 05/02/20 0053 05/03/20 0141  WBC 10.4 13.5* 9.4  HGB 13.6 12.4* 12.2*  HCT 41.4 36.7* 35.1*  PLT 143* 153 175   Recent Labs  Lab 05/01/20 0909 05/02/20 0053 05/03/20 0611  NA 132* 137 140  K 3.8 4.0 3.7  CL 101 105 107  CO2 20* 25 25  BUN 16 16 18   CREATININE 1.42* 1.34* 1.21  CALCIUM 8.2* 8.1* 8.3*  PROT 6.3* 5.8* 5.5*  BILITOT 1.3* 0.7 0.7  ALKPHOS 79 58 52  ALT 18 16 15   AST 57* 55* 49*  GLUCOSE 198* 165* 140*   Imaging/Diagnostic Tests: No results found.   Brandon Patience, DO 05/03/2020, 9:12 AM PGY-1, [place] Intern pager:  [phone], text pages welcome

## 2020-05-03 NOTE — Progress Notes (Signed)
FMLA paperwork delivered to patient.   Robie Mcniel, DO

## 2020-05-03 NOTE — TOC Initial Note (Addendum)
Transition of Care [facility]) - Initial/Assessment Note    Patient Details  Name: Brandon Carpenter MRN: [id number] Date of Birth: June 15, 1960  [facility] CM/SW Contact:    Marilu Favre, RN Phone Number: 05/03/2020, 11:28 AM  Clinical Narrative:                  Talked to patient and wife.   Patient does not have a PCP. Explained he can call the number on insurance card and be provided a complete list of providers in network.   If they know of a MD they would like to establish care with they can call that office directly to see if they are accepting new patient's with his insurance.   They are interested in establishing care with [facility] Medicine Teaching Service. NCM paged same. Dr Arby Barrette returned call. They will arrange a follow up appointment in their clinic and follow for PCP.Wife and patient aware.  Patient concerned he may be out of work. He does not have short or long term disability. They have talked to his work and determined he can apply for leave of absence. They stated they already asked MD for a letter.   Provided  [facility] assistance resources Expected Discharge Plan: Home/Self Care Barriers to Discharge: Continued Medical Work up   Patient Goals and CMS Choice Patient states their goals for this hospitalization and ongoing recovery are:: to return to home CMS Medicare.gov Compare Post Acute Care list provided to:: Patient Choice offered to / list presented to : Patient  Expected Discharge Plan and Services Expected Discharge Plan: Home/Self Care In-house Referral: Financial Counselor Discharge Planning Services: CM Consult   Living arrangements for the past 2 months: Single Family Home                   DME Agency: NA       HH Arranged: NA          Prior Living Arrangements/Services Living arrangements for the past 2 months: Single Family Home Lives with:: Spouse Patient language and need for interpreter reviewed:: Yes Do you feel safe  going back to the place where you live?: Yes      Need for Family Participation in Patient Care: Yes (Comment) Care giver support system in place?: Yes (comment)   Criminal Activity/Legal Involvement Pertinent to Current Situation/Hospitalization: No - Comment as needed  Activities of Daily Living Home Assistive Devices/Equipment: None ADL Screening (condition at time of admission) Patient's cognitive ability adequate to safely complete daily activities?: Yes Is the patient deaf or have difficulty hearing?: No Does the patient have difficulty seeing, even when wearing glasses/contacts?: No Does the patient have difficulty concentrating, remembering, or making decisions?: No Patient able to express need for assistance with ADLs?: Yes Does the patient have difficulty dressing or bathing?: No Independently performs ADLs?: Yes (appropriate for developmental age) Does the patient have difficulty walking or climbing stairs?: No Weakness of Legs: None Weakness of Arms/Hands: None  Permission Sought/Granted   Permission granted to share information with : Yes, Verbal Permission Granted  Share Information with NAME: Elmo Putt wife           Emotional Assessment Appearance:: Appears stated age     Orientation: : Oriented to Self,Oriented to Place,Oriented to  Time,Oriented to Situation Alcohol / Substance Use: Not Applicable Psych Involvement: No (comment)  Admission diagnosis:  Kidney stone [N20.0] Sepsis ([place]) [A41.9] Sepsis without acute organ dysfunction, due to unspecified organism [facility]) [A41.9] Patient Active Problem  List   Diagnosis Date Noted  . Urinary retention 05/02/2020  . Ureteral stone with hydronephrosis   . E. coli sepsis ([place]) 05/01/2020  . Personal history of COVID-19 03/23/2020   PCP:  Patient, No Pcp Per Pharmacy:   [id number] [place], [place]  [address] Phone: [phone] Fax: [phone]  [facility] [place] [place] - [address] [place] [address] Phone: [phone] Fax: [phone]     Social Determinants of Health (SDOH) Interventions    Readmission Risk Interventions No flowsheet data found.

## 2020-05-03 NOTE — Progress Notes (Signed)
Messaged Dr. Oleh Genin about pt concern. He has a little bump on his right index finger, middle knuckle. It is squishy and does not hurt to touch. It is a noticeable bump.

## 2020-05-05 LAB — HEPATITIS B SURFACE ANTIBODY, QUANTITATIVE: Hep B S AB Quant (Post): <3.1 m[IU]/mL — ABNORMAL LOW (ref >9.9)

## 2020-05-05 LAB — HCV INTERPRETATION

## 2020-05-05 LAB — HCV AB W REFLEX TO QUANT PCR: HCV Ab: <0.1 s/co ratio (ref 0.0–0.9)

## 2020-05-06 LAB — CULTURE, BLOOD (ROUTINE X 2)
Culture: NO GROWTH
Special Requests: ADEQUATE

## 2020-05-07 DIAGNOSIS — K769 Liver disease, unspecified: Secondary | ICD-10-CM | POA: Insufficient documentation

## 2020-05-07 NOTE — Progress Notes (Signed)
SUBJECTIVE:   CHIEF COMPLAINT / HPI:   Hospital follow-up-difficulty urinating: Patient is a 60 year old male that presented to the emergency department with urinary difficulties, shakiness, fever, cough, headache found to have urosepsis secondary to a complicated UTI as well as an AKI.  CT scan performed in the emergency department showed bilateral hydronephrosis with left greater than right and 7 mm left distal ureteral stone.   Urology was consulted and performed cystoscopy with a left ureteral stent placed on 2/28. He is going to have the catheter removed on Friday and surgery on March 22nd and a follow up on March 29th. He request a work note until he has his follow up on the 29th. Has not noticed any blood in his urine.  Discharge summary recommended follow-up tasks: Patient was discharged with urinary catheter with plan follow-up with urology for voiding trial.  Discharge summary recommended considering an ultrasound outpatient for hypodense lesion of the left lobe of the liver.  A1c was noted to be 6.2 and recommended in discharge summary for follow-up for prediabetic dietary education.  Nevi of left ear and left eyelid: Patient states that he has a nevi present on his left ear and left eyelid which she would like examined by dermatology at our clinic.  His spouse believes they may have had some change over the past 1 year.  He denies other concerns at this time.  PERTINENT  PMH / PSH: Recent hospitalization  OBJECTIVE:   BP (!) 110/58   Pulse 83   Ht 6\' 1"  (1.854 m)   Wt 169 lb 12.8 oz (77 kg)   SpO2 98%   BMI 22.40 kg/m    General: NAD, pleasant, able to participate in exam Cardiac: RRR, no murmurs. Respiratory: CTAB, normal effort, No wheezes, rales or rhonchi Abdomen: Bowel sounds present, nontender, nondistended Extremities: no edema or cyanosis. Skin: Small nevi present of the left ear and left eyelid, they are even in appearance and round with no ulceration or  drainage present.  No erythema.  They appear evenly colored. Neuro: alert, no obvious focal deficits Psych: Normal affect and mood  ASSESSMENT/PLAN:   Ureteral stone with hydronephrosis Assessment: 60 year old male presented for follow-up after discharge from the hospital for utero stone with hydronephrosis.  He continues with Foley catheter which plans to be removed by urology on Friday of this week.  He plans to continue to see urology and has a scheduled follow-up surgery on March 22 with them.  He denies any blood in his urine, denies any abdominal pains, and states he overall feels he is doing well. Plan: -Patient to continue to follow with urology as scheduled -We will provide a work note to keep patient out of work until the end of his surgery as he has multiple things going on limited his ability to get back to work -Patient plans to follow-up with [place] once he gets his urology appointments completed or sooner if he needs [place] beforehand.  Change in multiple nevi Assessment: 60 year old male with concern of a possible change in edema around his left ear as well as on his left eyelid.  His spouse states she thinks both of them may have changed over the past year but she is not entirely sure.  On physical exam they are round, appear evenly colored, and have no erythema, ulceration, or drainage present.  Patient was interested in getting set up with our dermatology clinic for further evaluation and to determine if any additional treatments such as  removal need to be accomplished.  Unfortunately this was mentioned at the end of the appointment as the patient was leaving and so time was limited for doing a full evaluation of the nevi. Plan: -We will send referral for our dermatology clinic for patient to follow-up with them about this.  He plans to make an appointment when he goes upfront. -Patient will let us know if he needs anything regarding this before hand.  Liver lesion Will order  ultrasound for further evaluation of the liver lesion to determine if it is cyst or not.  This has been ordered and scheduled and patient is aware.     Lurline Del, [place]    This note was prepared using Dragon voice recognition software and may include unintentional dictation errors due to the inherent limitations of voice recognition software.

## 2020-05-07 NOTE — Patient Instructions (Signed)
It was great to see you! Thank you for allowing me to participate in your care!  I recommend that you always bring your medications to each appointment as this makes it easy to ensure we are on the correct medications and helps [place] not miss when refills are needed.  Our plans for today:  -Continue to follow with urology as scheduled -Providing a work note for today -Have also provided a referral for a liver ultrasound to evaluate the liver lesion -I have also provided a referral for our dermatology clinic.  Please schedule a follow-up appointment with them when you go upfront.  Take care and seek immediate care sooner if you develop any concerns.   Dr. Lurline Del, [place]

## 2020-05-08 ENCOUNTER — Other Ambulatory Visit: Payer: Self-pay

## 2020-05-08 ENCOUNTER — Ambulatory Visit (INDEPENDENT_AMBULATORY_CARE_PROVIDER_SITE_OTHER): Payer: 59 | Admitting: Family Medicine

## 2020-05-08 VITALS — BP 110/58 | HR 83 | Ht 73.0 in | Wt 169.8 lb

## 2020-05-08 DIAGNOSIS — D229 Melanocytic nevi, unspecified: Secondary | ICD-10-CM | POA: Diagnosis not present

## 2020-05-08 DIAGNOSIS — K769 Liver disease, unspecified: Secondary | ICD-10-CM

## 2020-05-08 DIAGNOSIS — N132 Hydronephrosis with renal and ureteral calculous obstruction: Secondary | ICD-10-CM

## 2020-05-08 NOTE — Assessment & Plan Note (Signed)
Will order ultrasound for further evaluation of the liver lesion to determine if it is cyst or not.  This has been ordered and scheduled and patient is aware.

## 2020-05-08 NOTE — Assessment & Plan Note (Signed)
Assessment: 60 year old male presented for follow-up after discharge from the hospital for utero stone with hydronephrosis.  He continues with Foley catheter which plans to be removed by urology on Friday of this week.  He plans to continue to see urology and has a scheduled follow-up surgery on March 22 with them.  He denies any blood in his urine, denies any abdominal pains, and states he overall feels he is doing well. Plan: -Patient to continue to follow with urology as scheduled -We will provide a work note to keep patient out of work until the end of his surgery as he has multiple things going on limited his ability to get back to work -Patient plans to follow-up with [place] once he gets his urology appointments completed or sooner if he needs [place] beforehand.

## 2020-05-08 NOTE — Assessment & Plan Note (Signed)
Assessment: 60 year old male with concern of a possible change in edema around his left ear as well as on his left eyelid.  His spouse states she thinks both of them may have changed over the past year but she is not entirely sure.  On physical exam they are round, appear evenly colored, and have no erythema, ulceration, or drainage present.  Patient was interested in getting set up with our dermatology clinic for further evaluation and to determine if any additional treatments such as removal need to be accomplished.  Unfortunately this was mentioned at the end of the appointment as the patient was leaving and so time was limited for doing a full evaluation of the nevi. Plan: -We will send referral for our dermatology clinic for patient to follow-up with them about this.  He plans to make an appointment when he goes upfront. -Patient will let us know if he needs anything regarding this before hand.

## 2020-05-09 ENCOUNTER — Ambulatory Visit
Admission: RE | Admit: 2020-05-09 | Discharge: 2020-05-09 | Disposition: A | Discharge status: Home / Self Care | Payer: 59 | Source: Ambulatory Visit | Attending: Family Medicine | Admitting: Family Medicine

## 2020-05-09 DIAGNOSIS — K769 Liver disease, unspecified: Secondary | ICD-10-CM

## 2020-05-17 ENCOUNTER — Encounter (HOSPITAL_BASED_OUTPATIENT_CLINIC_OR_DEPARTMENT_OTHER): Payer: Self-pay | Admitting: Urology

## 2020-05-17 ENCOUNTER — Other Ambulatory Visit: Payer: Self-pay

## 2020-05-17 NOTE — Progress Notes (Signed)
Spoke w/ via phone for pre-op interview--- PT Lab needs dos----  Istat             Lab results------ current ekg in epic/ chart COVID test ------ 05-19-2020 @ 1435 Arrive at ------- 0730 on 05-23-2020 NPO after MN NO Solid Food.  Clear liquids from MN until--- 0530 Med rec completed Medications to take morning of surgery ----- Proscar, Flomax Diabetic medication ----- n/a Patient instructed to bring photo id and insurance card day of surgery Patient aware to have Driver (ride ) / caregiver    for 24 hours after surgery -- wife, Brandon Carpenter Patient Special Instructions ----- n/a Pre-Op special Istructions ----- n/a Patient verbalized understanding of instructions that were given at this phone interview. Patient denies shortness of breath, chest pain, fever, cough at this phone interview.

## 2020-05-18 ENCOUNTER — Ambulatory Visit (INDEPENDENT_AMBULATORY_CARE_PROVIDER_SITE_OTHER): Payer: 59 | Admitting: Family Medicine

## 2020-05-18 ENCOUNTER — Other Ambulatory Visit: Payer: Self-pay

## 2020-05-18 DIAGNOSIS — M6740 Ganglion, unspecified site: Secondary | ICD-10-CM | POA: Insufficient documentation

## 2020-05-18 DIAGNOSIS — D229 Melanocytic nevi, unspecified: Secondary | ICD-10-CM

## 2020-05-18 MED ORDER — FINASTERIDE 5 MG PO TABS: 5.0000 mg | ORAL_TABLET | Freq: Every day | ORAL | 0 refills | Status: AC

## 2020-05-18 MED ORDER — TAMSULOSIN HCL 0.4 MG PO CAPS: 0.4000 mg | ORAL_CAPSULE | Freq: Every day | ORAL | 0 refills | Status: DC

## 2020-05-18 NOTE — Patient Instructions (Signed)
We have scheduled you for biopsy of the lesion near your left ear on 06/01/20 at 2:50 PM in the dermatology lesion.     Ganglion Cyst  A ganglion cyst is a non-cancerous, fluid-filled lump of tissue that occurs near a joint, tendon, or ligament. The cyst grows out of a joint or the lining of a tendon or ligament. Ganglion cysts most often develop in the hand or wrist, but they can also develop in the shoulder, elbow, hip, knee, ankle, or foot. Ganglion cysts are ball-shaped or egg-shaped. Their size can range from the size of a pea to larger than a grape. Increased activity may cause the cyst to get bigger because more fluid starts to build up. What are the causes? The exact cause of this condition is not known, but it may be related to:  Inflammation or irritation around the joint.  An injury or tear in the layers of tissue around the joint (joint capsule).  Repetitive movements or overuse.  History of acute or repeated injury. What increases the risk? You are more likely to develop this condition if:  You are a male.  You are 19-97 years old. What are the signs or symptoms? The main symptom of this condition is a lump. It most often appears on the hand or wrist. In many cases, there are no other symptoms, but a cyst can sometimes cause:  Tingling.  Pain or tenderness.  Numbness.  Weakness or loss of strength in the affected joint.  Decreased range of motion in the affected area of the body.   How is this diagnosed? Ganglion cysts are usually diagnosed based on a physical exam. Your health care provider will feel the lump and may shine a light next to it. If it is a ganglion cyst, the light will likely shine through it. Your health care provider may order an X-ray, ultrasound, MRI, or CT scan to rule out other conditions. How is this treated? Ganglion cysts often go away on their own without treatment. If you have pain or other symptoms, treatment may be needed. Treatment is  also needed if the ganglion cyst limits your movement or if it gets infected. Treatment may include:  Wearing a brace or splint on your wrist or finger.  Taking anti-inflammatory medicine.  Having fluid drained from the lump with a needle (aspiration).  Getting an injection of medicine into the joint to decrease inflammation. This may be corticosteroids, ethanol, or hyaluronidase.  Having surgery to remove the ganglion cyst.  Placing a pad in your shoe or wearing shoes that will not rub against the cyst if it is on your foot. Follow these instructions at home:  Do not press on the ganglion cyst, poke it with a needle, or hit it.  Take over-the-counter and prescription medicines only as told by your health care provider.  If you have a brace or splint: ? Wear it as told by your health care provider. ? Remove it as told by your health care provider. Ask if you need to remove it when you take a shower or a bath.  Watch your ganglion cyst for any changes.  Keep all follow-up visits as told by your health care provider. This is important. Contact a health care provider if:  Your ganglion cyst becomes larger or more painful.  You have pus coming from the lump.  You have weakness or numbness in the affected area.  You have a fever or chills. Get help right away if:  You  have a fever and have any of these in the cyst area: ? Increased redness. ? Red streaks. ? Swelling. Summary  A ganglion cyst is a non-cancerous, fluid-filled lump that occurs near a joint, tendon, or ligament.  Ganglion cysts most often develop in the hand or wrist, but they can also develop in the shoulder, elbow, hip, knee, ankle, or foot.  Ganglion cysts often go away on their own without treatment. This information is not intended to replace advice given to you by your health care provider. Make sure you discuss any questions you have with your health care provider. Document Revised: 05/12/2019 Document  Reviewed: 05/12/2019 Elsevier Patient Education  [place].

## 2020-05-18 NOTE — Progress Notes (Signed)
    SUBJECTIVE:   CHIEF COMPLAINT / HPI: nevi and nodule on index finger   Index Finger Nodule Patient reports having a nodule on the index finger of his right hand for 1.5-2 months. The nodule is not tender, has no associated redness, does not limit movement of his digit and has not had any bleeding or drainage. He has not had any recent trauma to the finger/hand. 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   Moles of Face and Eyelid  Patient reports having dark area on his left top eyelid for several years. His wife states that it appears to be growing larger in size. Patient states that the lesion does not impact his vision, is not painful and does not affect his ability to blink.  Patient also reports a hyperpigmented lesion that was initially flat but has enlarged over the last few years. This lesion is also non-tender and without any drainage or bleeding. He would like to have this removed however, patient prefers to wait until after he has surgery for his kidney stone before having this procedure.    OBJECTIVE:   BP 122/70   Pulse 72   Ht 6\' 1"  (1.854 m)   Wt 171 lb 9.6 oz (77.8 kg)   SpO2 98%   BMI 22.64 kg/m    Media Information         Document Information  Photos    05/18/2020 13:49  Attached To:  Office Visit on 05/18/20 with [facility]   Source Information  Simmons-Robinson, Riki Sheer, MD  [facility]    Media Information         Document Information  Photos    05/18/2020 13:48  Attached To:  Office Visit on 05/18/20 with [facility]   Source Information  Simmons-Robinson, Riki Sheer, MD  [facility]    Media Information         Document Information  Photos    05/18/2020 13:48  Attached To:  Office Visit on 05/18/20 with [facility]   Source  Information  Simmons-Robinson, Financial risk analyst, MD  [facility]      ASSESSMENT/PLAN:   Change in multiple nevi Patient reports increased size in suspected nevi of eyelid and just anterior to left ear. Both reported to have increased in size. Patient would like to have lesion near his ear biopsied after his surgery on 3/22.  - scheduled for derm clinic on 3/31 for biopsy  -discussed risks and benefits of procedure with patient today   Ganglion cyst Counseled patient on likely course of spontaneous resolution of lesion on right hand nodule. Soft and mobile, non tender with overlying normal appearance of skin. Normal ROM of involved digit and joint.  Will continue to monitor  Handout given on ganglion cysts    F/u as scheduled   Eulis Foster, MD [place]

## 2020-05-18 NOTE — Assessment & Plan Note (Signed)
Patient reports increased size in suspected nevi of eyelid and just anterior to left ear. Both reported to have increased in size. Patient would like to have lesion near his ear biopsied after his surgery on 3/22.  - scheduled for derm clinic on 3/31 for biopsy  -discussed risks and benefits of procedure with patient today

## 2020-05-18 NOTE — Assessment & Plan Note (Signed)
Counseled patient on likely course of spontaneous resolution of lesion on right hand nodule. Soft and mobile, non tender with overlying normal appearance of skin. Normal ROM of involved digit and joint.  Will continue to monitor  Handout given on ganglion cysts

## 2020-05-19 ENCOUNTER — Other Ambulatory Visit (HOSPITAL_COMMUNITY)
Admission: RE | Admit: 2020-05-19 | Discharge: 2020-05-19 | Disposition: A | Discharge status: Home / Self Care | Payer: 59 | Source: Ambulatory Visit | Attending: Urology | Admitting: Urology

## 2020-05-19 DIAGNOSIS — Z20822 Contact with and (suspected) exposure to covid-19: Secondary | ICD-10-CM | POA: Diagnosis not present

## 2020-05-19 DIAGNOSIS — Z01812 Encounter for preprocedural laboratory examination: Secondary | ICD-10-CM | POA: Diagnosis present

## 2020-05-20 LAB — SARS CORONAVIRUS 2 (TAT 6-24 HRS): SARS Coronavirus 2: NEGATIVE

## 2020-05-22 NOTE — Anesthesia Preprocedure Evaluation (Addendum)
Anesthesia Evaluation  Patient identified by MRN, date of birth, ID band Patient awake    Reviewed: Allergy & Precautions, NPO status , Patient's Chart, lab work & pertinent test results  Airway Mallampati: I       Dental no notable dental hx.    Pulmonary neg pulmonary ROS,    Pulmonary exam normal        Cardiovascular negative cardio ROS Normal cardiovascular exam Rate:Normal     Neuro/Psych negative neurological ROS  negative psych ROS   GI/Hepatic negative GI ROS, Neg liver ROS,   Endo/Other    Renal/GU    History noted Dr. Nyoka Cowden negative genitourinary   Musculoskeletal negative musculoskeletal ROS (+)   Abdominal Normal abdominal exam  (+)   Peds  Hematology negative hematology ROS (+)   Anesthesia Other Findings   Reproductive/Obstetrics                            Anesthesia Physical  Anesthesia Plan  ASA: II  Anesthesia Plan: General   Post-op Pain Management:    Induction: Intravenous  PONV Risk Score and Plan: 4 or greater and Ondansetron, Midazolam and Dexamethasone  Airway Management Planned: LMA  Additional Equipment: None  Intra-op Plan:   Post-operative Plan: Extubation in OR  Informed Consent: I have reviewed the patients History and Physical, chart, labs and discussed the procedure including the risks, benefits and alternatives for the proposed anesthesia with the patient or authorized representative who has indicated his/her understanding and acceptance.     Dental advisory given  Plan Discussed with: CRNA  Anesthesia Plan Comments:        Anesthesia Quick Evaluation

## 2020-05-23 ENCOUNTER — Other Ambulatory Visit: Payer: Self-pay

## 2020-05-23 ENCOUNTER — Ambulatory Visit (HOSPITAL_BASED_OUTPATIENT_CLINIC_OR_DEPARTMENT_OTHER)
Admission: RE | Admit: 2020-05-23 | Discharge: 2020-05-23 | Disposition: A | Discharge status: Home / Self Care | Payer: 59 | Attending: Urology | Admitting: Urology

## 2020-05-23 ENCOUNTER — Ambulatory Visit (HOSPITAL_BASED_OUTPATIENT_CLINIC_OR_DEPARTMENT_OTHER): Payer: 59 | Admitting: Anesthesiology

## 2020-05-23 ENCOUNTER — Encounter (HOSPITAL_BASED_OUTPATIENT_CLINIC_OR_DEPARTMENT_OTHER): Payer: Self-pay | Admitting: Urology

## 2020-05-23 ENCOUNTER — Encounter (HOSPITAL_BASED_OUTPATIENT_CLINIC_OR_DEPARTMENT_OTHER)
Admission: RE | Disposition: A | Discharge status: Home / Self Care | Payer: Self-pay | Source: Home / Self Care | Attending: Urology

## 2020-05-23 DIAGNOSIS — Z880 Allergy status to penicillin: Secondary | ICD-10-CM | POA: Diagnosis not present

## 2020-05-23 DIAGNOSIS — N132 Hydronephrosis with renal and ureteral calculous obstruction: Secondary | ICD-10-CM | POA: Insufficient documentation

## 2020-05-23 HISTORY — DX: Calculus of ureter: N20.1

## 2020-05-23 HISTORY — DX: Benign prostatic hyperplasia with lower urinary tract symptoms: N40.1

## 2020-05-23 HISTORY — PX: CYSTOSCOPY/URETEROSCOPY/HOLMIUM LASER/STENT PLACEMENT: SHX6546

## 2020-05-23 LAB — POCT I-STAT, CHEM 8
BUN: 13 mg/dL (ref 6–20)
Calcium, Ion: 1.3 mmol/L (ref 1.15–1.40)
Chloride: 105 mmol/L (ref 98–111)
Creatinine, Ser: 1.2 mg/dL (ref 0.61–1.24)
Glucose, Bld: 117 mg/dL — ABNORMAL HIGH (ref 70–99)
HCT: 47 % (ref 39.0–52.0)
Hemoglobin: 16 g/dL (ref 13.0–17.0)
Potassium: 3.9 mmol/L (ref 3.5–5.1)
Sodium: 142 mmol/L (ref 135–145)
TCO2: 23 mmol/L (ref 22–32)

## 2020-05-23 SURGERY — CYSTOSCOPY/URETEROSCOPY/HOLMIUM LASER/STENT PLACEMENT
Anesthesia: General | Site: Ureter | Laterality: Left

## 2020-05-23 MED ORDER — TAMSULOSIN HCL 0.4 MG PO CAPS: 0.8000 mg | ORAL_CAPSULE | Freq: Every day | ORAL | 11 refills | Status: AC

## 2020-05-23 MED ORDER — CIPROFLOXACIN IN D5W 400 MG/200ML IV SOLN
400.0000 mg | INTRAVENOUS | Status: AC
  Administered 2020-05-23: 400 mg via INTRAVENOUS

## 2020-05-23 MED ORDER — ACETAMINOPHEN 325 MG PO TABS: 325.0000 mg | ORAL_TABLET | ORAL | Status: DC | PRN

## 2020-05-23 MED ORDER — OXYCODONE HCL 5 MG PO TABS: 5.0000 mg | ORAL_TABLET | ORAL | Status: DC | PRN

## 2020-05-23 MED ORDER — LIDOCAINE 2% (20 MG/ML) 5 ML SYRINGE
INTRAMUSCULAR | Status: AC
  Filled 2020-05-23: qty 5

## 2020-05-23 MED ORDER — LIDOCAINE 2% (20 MG/ML) 5 ML SYRINGE
INTRAMUSCULAR | Status: DC | PRN
  Administered 2020-05-23: 100 mg via INTRAVENOUS

## 2020-05-23 MED ORDER — ACETAMINOPHEN 10 MG/ML IV SOLN: 1000.0000 mg | Freq: Once | INTRAVENOUS | Status: DC | PRN

## 2020-05-23 MED ORDER — CIPROFLOXACIN IN D5W 400 MG/200ML IV SOLN
INTRAVENOUS | Status: AC
  Filled 2020-05-23: qty 200

## 2020-05-23 MED ORDER — SODIUM CHLORIDE 0.9% FLUSH: 3.0000 mL | Freq: Two times a day (BID) | INTRAVENOUS | Status: DC

## 2020-05-23 MED ORDER — TAMSULOSIN HCL 0.4 MG PO CAPS: 0.4000 mg | ORAL_CAPSULE | Freq: Every day | ORAL | Status: DC

## 2020-05-23 MED ORDER — OXYCODONE HCL 5 MG PO TABS
5.0000 mg | ORAL_TABLET | Freq: Once | ORAL | Status: DC | PRN
Start: 2020-05-23 — End: 2020-05-23

## 2020-05-23 MED ORDER — MIDAZOLAM HCL 2 MG/2ML IJ SOLN
INTRAMUSCULAR | Status: DC | PRN
  Administered 2020-05-23: 2 mg via INTRAVENOUS

## 2020-05-23 MED ORDER — EPHEDRINE SULFATE-NACL 50-0.9 MG/10ML-% IV SOSY
PREFILLED_SYRINGE | INTRAVENOUS | Status: DC | PRN
  Administered 2020-05-23: 10 mg via INTRAVENOUS
  Administered 2020-05-23 (×2): 5 mg via INTRAVENOUS
  Administered 2020-05-23: 10 mg via INTRAVENOUS

## 2020-05-23 MED ORDER — MEPERIDINE HCL 25 MG/ML IJ SOLN
6.2500 mg | INTRAMUSCULAR | Status: DC | PRN
Start: 2020-05-23 — End: 2020-05-23

## 2020-05-23 MED ORDER — DEXAMETHASONE SODIUM PHOSPHATE 10 MG/ML IJ SOLN
INTRAMUSCULAR | Status: AC
  Filled 2020-05-23: qty 1

## 2020-05-23 MED ORDER — SODIUM CHLORIDE 0.9 % IV SOLN: 250.0000 mL | INTRAVENOUS | Status: DC | PRN

## 2020-05-23 MED ORDER — ONDANSETRON HCL 4 MG/2ML IJ SOLN: 4.0000 mg | Freq: Once | INTRAMUSCULAR | Status: DC | PRN

## 2020-05-23 MED ORDER — CIPROFLOXACIN HCL 500 MG PO TABS: 500.0000 mg | ORAL_TABLET | Freq: Two times a day (BID) | ORAL | 0 refills | Status: DC

## 2020-05-23 MED ORDER — LACTATED RINGERS IV SOLN: INTRAVENOUS | Status: DC

## 2020-05-23 MED ORDER — SODIUM CHLORIDE 0.9 % IR SOLN
Status: DC | PRN
  Administered 2020-05-23: 3000 mL

## 2020-05-23 MED ORDER — ACETAMINOPHEN 325 MG PO TABS: 650.0000 mg | ORAL_TABLET | ORAL | Status: DC | PRN

## 2020-05-23 MED ORDER — EPHEDRINE 5 MG/ML INJ
INTRAVENOUS | Status: AC
  Filled 2020-05-23: qty 10

## 2020-05-23 MED ORDER — DEXAMETHASONE SODIUM PHOSPHATE 10 MG/ML IJ SOLN
INTRAMUSCULAR | Status: DC | PRN
  Administered 2020-05-23: 10 mg via INTRAVENOUS

## 2020-05-23 MED ORDER — PROPOFOL 10 MG/ML IV BOLUS
INTRAVENOUS | Status: DC | PRN
  Administered 2020-05-23: 150 mg via INTRAVENOUS

## 2020-05-23 MED ORDER — PROPOFOL 10 MG/ML IV BOLUS
INTRAVENOUS | Status: AC
  Filled 2020-05-23: qty 20

## 2020-05-23 MED ORDER — SODIUM CHLORIDE 0.9% FLUSH: 3.0000 mL | INTRAVENOUS | Status: DC | PRN

## 2020-05-23 MED ORDER — OXYCODONE HCL 5 MG/5ML PO SOLN
5.0000 mg | Freq: Once | ORAL | Status: DC | PRN
Start: 2020-05-23 — End: 2020-05-23

## 2020-05-23 MED ORDER — ACETAMINOPHEN 325 MG RE SUPP: 650.0000 mg | RECTAL | Status: DC | PRN

## 2020-05-23 MED ORDER — FENTANYL CITRATE (PF) 100 MCG/2ML IJ SOLN
INTRAMUSCULAR | Status: DC | PRN
  Administered 2020-05-23 (×2): 50 ug via INTRAVENOUS

## 2020-05-23 MED ORDER — FENTANYL CITRATE (PF) 100 MCG/2ML IJ SOLN
25.0000 ug | INTRAMUSCULAR | Status: DC | PRN
Start: 2020-05-23 — End: 2020-05-23

## 2020-05-23 MED ORDER — ONDANSETRON HCL 4 MG/2ML IJ SOLN
INTRAMUSCULAR | Status: AC
  Filled 2020-05-23: qty 2

## 2020-05-23 MED ORDER — KETOROLAC TROMETHAMINE 30 MG/ML IJ SOLN
INTRAMUSCULAR | Status: AC
  Filled 2020-05-23: qty 1

## 2020-05-23 MED ORDER — ONDANSETRON HCL 4 MG/2ML IJ SOLN
INTRAMUSCULAR | Status: DC | PRN
  Administered 2020-05-23: 4 mg via INTRAVENOUS

## 2020-05-23 MED ORDER — ACETAMINOPHEN 160 MG/5ML PO SOLN
325.0000 mg | ORAL | Status: DC | PRN
Start: 2020-05-23 — End: 2020-05-23

## 2020-05-23 MED ORDER — MORPHINE SULFATE (PF) 4 MG/ML IV SOLN: 2.0000 mg | INTRAVENOUS | Status: DC | PRN

## 2020-05-23 MED ORDER — KETOROLAC TROMETHAMINE 30 MG/ML IJ SOLN
INTRAMUSCULAR | Status: DC | PRN
  Administered 2020-05-23: 30 mg via INTRAVENOUS

## 2020-05-23 MED ORDER — MIDAZOLAM HCL 2 MG/2ML IJ SOLN
INTRAMUSCULAR | Status: AC
  Filled 2020-05-23: qty 2

## 2020-05-23 MED ORDER — ARTIFICIAL TEARS OPHTHALMIC OINT
TOPICAL_OINTMENT | OPHTHALMIC | Status: AC
  Filled 2020-05-23: qty 3.5

## 2020-05-23 MED ORDER — FENTANYL CITRATE (PF) 100 MCG/2ML IJ SOLN
INTRAMUSCULAR | Status: AC
  Filled 2020-05-23: qty 2

## 2020-05-23 MED ORDER — HYDROCODONE-ACETAMINOPHEN 5-325 MG PO TABS: 1.0000 | ORAL_TABLET | Freq: Four times a day (QID) | ORAL | 0 refills | Status: DC | PRN

## 2020-05-23 SURGICAL SUPPLY — 28 items
BAG DRAIN URO-CYSTO SKYTR STRL (DRAIN) ×2 IMPLANT
BAG DRN UROCATH (DRAIN) ×1
BASKET STONE 1.7 NGAGE (UROLOGICAL SUPPLIES) ×2 IMPLANT
BASKET ZERO TIP NITINOL 2.4FR (BASKET) IMPLANT
BSKT STON RTRVL ZERO TP 2.4FR (BASKET)
CATH URET 5FR 28IN CONE TIP (BALLOONS)
CATH URET 5FR 28IN OPEN ENDED (CATHETERS) IMPLANT
CATH URET 5FR 70CM CONE TIP (BALLOONS) IMPLANT
CLOTH BEACON ORANGE TIMEOUT ST (SAFETY) ×2 IMPLANT
DRSG TEGADERM 2-3/8X2-3/4 SM (GAUZE/BANDAGES/DRESSINGS) ×2 IMPLANT
ELECT REM PT RETURN 9FT ADLT (ELECTROSURGICAL)
ELECTRODE REM PT RTRN 9FT ADLT (ELECTROSURGICAL) IMPLANT
FIBER LASER FLEXIVA 365 (UROLOGICAL SUPPLIES) ×2 IMPLANT
GLOVE SURG POLYISO LF SZ8 (GLOVE) ×2 IMPLANT
GOWN STRL REUS W/TWL XL LVL3 (GOWN DISPOSABLE) ×2 IMPLANT
GUIDEWIRE ANG ZIPWIRE 038X150 (WIRE) IMPLANT
GUIDEWIRE STR DUAL SENSOR (WIRE) ×2 IMPLANT
INFUSOR MANOMETER BAG 3000ML (MISCELLANEOUS) IMPLANT
IV NS IRRIG 3000ML ARTHROMATIC (IV SOLUTION) ×2 IMPLANT
KIT TURNOVER CYSTO (KITS) ×2 IMPLANT
MANIFOLD NEPTUNE II (INSTRUMENTS) ×2 IMPLANT
NS IRRIG 500ML POUR BTL (IV SOLUTION) ×2 IMPLANT
PACK CYSTO (CUSTOM PROCEDURE TRAY) ×2 IMPLANT
STENT URET 6FRX26 CONTOUR (STENTS) ×2 IMPLANT
TRACTIP FLEXIVA PULS ID 200XHI (Laser) IMPLANT
TRACTIP FLEXIVA PULSE ID 200 (Laser)
TUBE CONNECTING 12X1/4 (SUCTIONS) ×2 IMPLANT
TUBING UROLOGY SET (TUBING) IMPLANT

## 2020-05-23 NOTE — Op Note (Signed)
Procedure: 1.  Cystoscopy with left ureteroscopic stone extraction with holmium laser application and left ureteral stent exchange. 2.  Application of fluoroscopy.  Preop diagnosis: 7 mm left distal ureteral stone.  Postoperative diagnosis: Same.  Surgeon: Dr. Irine Seal.  Anesthesia: General.  Specimen: Stone fragments.  Drains: 6 [place] by 26 cm left contour double-J stent with tether.  EBL: None.  Complications: None.  Indications: The patient is a 60 year old male who originally presented to the emergency room with sepsis and was found to be in urinary retention with a 7 mm obstructing left ureteral stone.  He previously underwent stenting and Foley catheter placement and was started on tamsulosin and finasteride.  He subsequently been able to void after Foley removal and returns today for definitive stone management.  Procedure: Taken operating room where he was given Cipro.  A general anesthetic was induced.  He was placed in lithotomy position and fitted with PAS hose.  His perineum and genitalia were prepped with Betadine solution he was draped in usual sterile fashion.  Cystoscopy was performed using a 23 [place] scope and 30 degree lens.  Examination revealed a normal urethra.  The external sphincter was intact.  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  Examination of bladder revealed mild trabeculation.  There was some mucosal irritation from the stent at the left ureteral orifice.  The right ureteral orifice was unremarkable.  The stent was grasped and pulled to the urethral meatus.  A sensor wire was then advanced the kidney under fluoroscopic guidance and the stent was removed.  The dual-lumen semirigid ureteroscope was then passed per urethra and advanced alongside the wire to the level of the stone.  The stone was then engaged with a 365 m holmium laser fiber that was initially set on 1.5 J and 20 Hz with the prior increase to 1 J as needed.  The  stone was readily broken into several fragments.  The fragments were then removed to the bladder with an engage basket.  Once final inspection demonstrated no significant residual fragments, the ureteroscope was removed.  The cystoscope was then inserted over the wire and a 6 [place] by 26 cm contour double-J stent was advanced the kidney under fluoroscopic guidance.  The cystoscope was then removed leaving the stent string exiting urethra.  Fluoroscopy demonstrated good final position of the stent.  The cystoscope was then reinserted and the stone fragments were evacuated from the bladder.  The bladder was then partially drained, the cystoscope was removed and he was taken down from lithotomy position.  His anesthetic was reversed and he was moved recovery in stable condition.  There were no complications.

## 2020-05-23 NOTE — Anesthesia Procedure Notes (Signed)
Procedure Name: LMA Insertion Date/Time: 05/23/2020 7:35 AM Performed by: Mechele Claude, CRNA Pre-anesthesia Checklist: Patient identified, Emergency Drugs available, Suction available and Patient being monitored Patient Re-evaluated:Patient Re-evaluated prior to induction Oxygen Delivery Method: Circle system utilized Preoxygenation: Pre-oxygenation with 100% oxygen Induction Type: IV induction Ventilation: Mask ventilation without difficulty LMA: LMA inserted LMA Size: 5.0 Number of attempts: 1 Airway Equipment and Method: Bite block Placement Confirmation: positive ETCO2 Tube secured with: Tape Dental Injury: Teeth and Oropharynx as per pre-operative assessment

## 2020-05-23 NOTE — Interval H&P Note (Signed)
History and Physical Interval Note:  He has been voiding well on the tamsulosin 0.8mg  and finasteride.  He has had no pain.   05/23/2020 7:17 AM  Inez Pilgrim  has presented today for surgery, with the diagnosis of LEFT DISTAL STONE.  The various methods of treatment have been discussed with the patient and family. After consideration of risks, benefits and other options for treatment, the patient has consented to  Procedure(s): CYSTOSCOPY/ LEFT UETEROSCOPY/HOLMIUM LASER/STENT EXCHANGE (Left) as a surgical intervention.  The patient's history has been reviewed, patient examined, no change in status, stable for surgery.  I have reviewed the patient's chart and labs.  Questions were answered to the patient's satisfaction.     Irine Seal

## 2020-05-23 NOTE — Anesthesia Postprocedure Evaluation (Signed)
Anesthesia Post Note  Patient: Brandon Carpenter  Procedure(s) Performed: CYSTOSCOPY/ LEFT UETEROSCOPY/HOLMIUM LASER/STENT EXCHANGE (Left Ureter)     Patient location during evaluation: PACU Anesthesia Type: General Level of consciousness: awake and sedated Pain management: pain level controlled Vital Signs Assessment: post-procedure vital signs reviewed and stable Respiratory status: spontaneous breathing Cardiovascular status: stable Postop Assessment: no apparent nausea or vomiting Anesthetic complications: no   No complications documented.  Last Vitals:  Vitals:   05/23/20 0830 05/23/20 0845  BP: 122/77 119/72  Pulse: 85 73  Resp: (!) 21 13  Temp:    SpO2: 100% 98%    Last Pain:  Vitals:   05/23/20 0845  TempSrc:   PainSc: 0-No pain                 Huston Foley

## 2020-05-23 NOTE — Transfer of Care (Signed)
Immediate Anesthesia Transfer of Care Note  Patient: Brandon Carpenter  Procedure(s) Performed: Procedure(s) (LRB): CYSTOSCOPY/ LEFT UETEROSCOPY/HOLMIUM LASER/STENT EXCHANGE (Left)  Patient Location: PACU  Anesthesia Type: General  Level of Consciousness: awake, alert  and oriented  Airway & Oxygen Therapy: Patient Spontanous Breathing and Patient connected to nasal cannula oxygen  Post-op Assessment: Report given to PACU RN and Post -op Vital signs reviewed and stable  Post vital signs: Reviewed and stable  Complications: No apparent anesthesia complications Last Vitals:  Vitals Value Taken Time  BP 123/82 05/23/20 0815  Temp    Pulse 84 05/23/20 0816  Resp 13 05/23/20 0816  SpO2 100 % 05/23/20 0816  Vitals shown include unvalidated device data.  Last Pain:  Vitals:   05/23/20 0541  TempSrc: Oral  PainSc: 0-No pain         Complications: No complications documented.

## 2020-05-23 NOTE — Discharge Instructions (Addendum)
Ureteral Stent Implantation, Care After This sheet gives you information about how to care for yourself after your procedure. Your health care provider may also give you more specific instructions. If you have problems or questions, contact your health care provider. What can I expect after the procedure? After the procedure, it is common to have:  Nausea.  Mild pain when you urinate. You may feel this pain in your lower back or lower abdomen. The pain should stop within a few minutes after you urinate. This may last for up to 1 week.  A small amount of blood in your urine for several days. Follow these instructions at home: Medicines  Take over-the-counter and prescription medicines only as told by your health care provider.  If you were prescribed an antibiotic medicine, take it as told by your health care provider. Do not stop taking the antibiotic even if you start to feel better.  Do not drive for 24 hours if you were given a sedative during your procedure.  Ask your health care provider if the medicine prescribed to you requires you to avoid driving or using heavy machinery. Activity  Rest as told by your health care provider.  Avoid sitting for a long time without moving. Get up to take short walks every 1-2 hours. This is important to improve blood flow and breathing. Ask for help if you feel weak or unsteady.  Return to your normal activities as told by your health care provider. Ask your health care provider what activities are safe for you. General instructions  Watch for any blood in your urine. Call your health care provider if the amount of blood in your urine increases.  If you have a catheter: ? Follow instructions from your health care provider about taking care of your catheter and collection bag. ? Do not take baths, swim, or use a hot tub until your health care provider approves. Ask your health care provider if you may take showers. You may only be allowed to  take sponge baths.  Drink enough fluid to keep your urine pale yellow.  Do not use any products that contain nicotine or tobacco, such as cigarettes, e-cigarettes, and chewing tobacco. These can delay healing after surgery. If you need help quitting, ask your health care provider.  Keep all follow-up visits as told by your health care provider. This is important.   Contact a health care provider if:  You have pain that gets worse or does not get better with medicine, especially pain when you urinate.  You have difficulty urinating.  You feel nauseous or you vomit repeatedly during a period of more than 2 days after the procedure. Get help right away if:  Your urine is dark red or has blood clots in it.  You are leaking urine (have incontinence).  The end of the stent comes out of your urethra.  You cannot urinate.  You have sudden, sharp, or severe pain in your abdomen or lower back.  You have a fever.  You have swelling or pain in your legs.  You have difficulty breathing. Summary  After the procedure, it is common to have mild pain when you urinate that goes away within a few minutes after you urinate. This may last for up to 1 week.  Watch for any blood in your urine. Call your health care provider if the amount of blood in your urine increases.  Take over-the-counter and prescription medicines only as told by your health care provider. Drink  enough fluid to keep your urine pale yellow.  You may remove the stent by pulling the attached string on Friday morning.  If you don't feel you can do this, please call the office to come have it removed Friday morning.   This information is not intended to replace advice given to you by your health care provider. Make sure you discuss any questions you have with your health care provider. Document Revised: 11/25/2017 Document Reviewed: 11/26/2017 Elsevier Patient Education  2021 [place]  Instructions  Activity: Get plenty of rest for the remainder of the day. A responsible individual must stay with you for 24 hours following the procedure.  For the next 24 hours, DO NOT: -Drive a car -Paediatric nurse -Drink alcoholic beverages -Take any medication unless instructed by your physician -Make any legal decisions or sign important papers.  Meals: Start with liquid foods such as gelatin or soup. Progress to regular foods as tolerated. Avoid greasy, spicy, heavy foods. If nausea and/or vomiting occur, drink only clear liquids until the nausea and/or vomiting subsides. Call your physician if vomiting continues.  Special Instructions/Symptoms: Your throat may feel dry or sore from the anesthesia or the breathing tube placed in your throat during surgery. If this causes discomfort, gargle with warm salt water. The discomfort should disappear within 24 hours.  [facility] [phone] Post Ureteroscopy With Stent Instructions  Definitions:  Ureter: The duct that transports urine from the kidney to the bladder. Stent:   A plastic hollow tube that is placed into the ureter, from the kidney to the bladder to prevent the ureter from swelling shut.  GENERAL INSTRUCTIONS:  Despite the fact that no skin incisions were used, the area around the ureter and bladder is raw and irritated. The stent is a foreign body which will further irritate the bladder wall. This irritation is manifested by increased frequency of urination, both day and night, and by an increase in the urge to urinate. In some, the urge to urinate is present almost always. Sometimes the urge is strong enough that you may not be able to stop yourself from urinating. The only real cure is to remove the stent and then give time for the bladder wall to heal which can't be done until the danger of the ureter swelling shut has passed, which varies.  You may see some blood in your urine while the stent is in  place and a few days afterwards. Do not be alarmed, even if the urine was clear for a while. Get off your feet and drink lots of fluids until clearing occurs. If you start to pass clots or don't improve, call us.  DIET: You may return to your normal diet immediately. Because of the raw surface of your bladder, alcohol, spicy foods, acid type foods and drinks with caffeine may cause irritation or frequency and should be used in moderation. To keep your urine flowing freely and to avoid constipation, drink plenty of fluids during the day ( 8-10 glasses ). Tip: Avoid cranberry juice because it is very acidic.  ACTIVITY: Your physical activity doesn't need to be restricted. However, if you are very active, you may see some blood in your urine. We suggest that you reduce your activity under these circumstances until the bleeding has stopped.  BOWELS: It is important to keep your bowels regular during the postoperative period. Straining with bowel movements can cause bleeding. A bowel movement every other day is reasonable. Use  a mild laxative if needed, such as Milk of Magnesia 2-3 tablespoons, or 2 Dulcolax tablets. Call if you continue to have problems. If you have been taking narcotics for pain, before, during or after your surgery, you may be constipated. Take a laxative if necessary.   MEDICATION: You should resume your pre-surgery medications unless told not to. In addition you will often be given an antibiotic to prevent infection. These should be taken as prescribed until the bottles are finished unless you are having an unusual reaction to one of the drugs.  PROBLEMS YOU SHOULD REPORT TO [place]:  Fevers over 100.5 Fahrenheit.  Heavy bleeding, or clots ( See above notes about blood in urine ).  Inability to urinate.  Drug reactions ( hives, rash, nausea, vomiting, diarrhea ).  Severe burning or pain with urination that is not improving.  FOLLOW-UP: You will need a follow-up appointment to  monitor your progress. Call for this appointment at the number listed above. Usually the first appointment will be about three to fourteen days after your surgery.

## 2020-05-24 ENCOUNTER — Encounter (HOSPITAL_BASED_OUTPATIENT_CLINIC_OR_DEPARTMENT_OTHER): Payer: Self-pay | Admitting: Urology

## 2020-06-01 ENCOUNTER — Other Ambulatory Visit: Payer: Self-pay

## 2020-06-01 ENCOUNTER — Ambulatory Visit (INDEPENDENT_AMBULATORY_CARE_PROVIDER_SITE_OTHER): Payer: 59 | Admitting: Family Medicine

## 2020-06-01 VITALS — BP 125/75 | HR 77 | Ht 73.0 in | Wt 174.6 lb

## 2020-06-01 DIAGNOSIS — D229 Melanocytic nevi, unspecified: Secondary | ICD-10-CM

## 2020-06-01 DIAGNOSIS — R229 Localized swelling, mass and lump, unspecified: Secondary | ICD-10-CM | POA: Diagnosis not present

## 2020-06-01 NOTE — Progress Notes (Signed)
Patient ID: Brandon Carpenter, male   DOB: Apr 15, 1960, 60 y.o.   MRN: [id number] See resident's note

## 2020-06-01 NOTE — Progress Notes (Signed)
    SUBJECTIVE:   CHIEF COMPLAINT / HPI:   Nevus Patient endorses nevus just anterior to his left ear that started years ago that has progressively grown in size since it started developing. Denies any associated pain or pruritus. Denies any drainage. Interested in getting a biopsy today as he was waiting to get this done after his renal stone surgery earlier this month.   OBJECTIVE:   BP 125/75   Pulse 77   Ht 6\' 1"  (1.854 m)   Wt 174 lb 9.6 oz (79.2 kg)   SpO2 98%   BMI 23.04 kg/m   General: Patient well-appearing, in no acute distress. Resp: normal work of breathing Derm: 1 cm hyperpigmented, circumscribed nodule without drainage located just anterior to left tragus, no known rashes or lesions   Neuro: normal gait Psych: mood appropriate   Shave Biopsy Procedure Note  Pre-operative Diagnosis: Nevi  Post-operative Diagnosis: same  Locations:anterior left ear  Indications: biopsy  Anesthesia: Lidocaine 1% with epinephrine without added sodium bicarbonate  Procedure Details  History of allergy to iodine: no  Patient informed of the risks (including bleeding and infection) and benefits of the  procedure and Written informed consent obtained.  The lesion and surrounding area were given a sterile prep using betadyne and draped in the usual sterile fashion. A scalpel was used to shave an area of skin approximately 1cm by 1cm.  Hemostasis achieved with silver nitrate. Antibiotic ointment and a sterile dressing applied.  The specimen was sent for pathologic examination. The patient tolerated the procedure well.  EBL: 0 ml  Findings: pending  Condition: Stable  Complications: none.   ASSESSMENT/PLAN:   Nevus -likely benign given rate and progression of growth and appearance. Noted to be symmetrical, circumscribed and uniform color. Shave biopsy performed and sample sent for pathology.  -return precautions and after care     Donney Dice, [place]

## 2020-06-01 NOTE — Patient Instructions (Signed)
Wound Care, Adult Taking care of your wound properly can help to prevent pain, infection, and scarring. It can also help your wound heal more quickly. Follow instructions from your health care provider about how to care for your wound. Supplies needed:  Soap and water.  Wound cleanser.  Gauze.  If needed, a clean bandage (dressing) or other type of wound dressing material to cover or place in the wound. Follow your health care provider's instructions about what dressing supplies to use.  Cream or ointment to apply to the wound, if told by your health care provider. How to care for your wound Cleaning the wound Ask your health care provider how to clean the wound. This may include:  Using mild soap and water or a wound cleanser.  Using a clean gauze to pat the wound dry after cleaning it. Do not rub or scrub the wound. Dressing care  Wash your hands with soap and water for at least 20 seconds before and after you change the dressing. If soap and water are not available, use hand sanitizer.  Change your dressing as told by your health care provider. This may include: ? Cleaning or rinsing out (irrigating) the wound. ? Placing a dressing over the wound or in the wound (packing). ? Covering the wound with an outer dressing.  Leave any stitches (sutures), skin glue, or adhesive strips in place. These skin closures may need to stay in place for 2 weeks or longer. If adhesive strip edges start to loosen and curl up, you may trim the loose edges. Do not remove adhesive strips completely unless your health care provider tells you to do that.  Ask your health care provider when you can leave the wound uncovered. Checking for infection Check your wound area every day for signs of infection. Check for:  More redness, swelling, or pain.  Fluid or blood.  Warmth.  Pus or a bad smell.   Follow these instructions at home Medicines  If you were prescribed an antibiotic medicine, cream, or  ointment, take or apply it as told by your health care provider. Do not stop using the antibiotic even if your condition improves.  If you were prescribed pain medicine, take it 30 minutes before you do any wound care or as told by your health care provider.  Take over-the-counter and prescription medicines only as told by your health care provider. Eating and drinking  Eat a diet that includes protein, vitamin A, vitamin C, and other nutrient-rich foods to help the wound heal. ? Foods rich in protein include meat, fish, eggs, dairy, beans, and nuts. ? Foods rich in vitamin A include carrots and dark green, leafy vegetables. ? Foods rich in vitamin C include citrus fruits, tomatoes, broccoli, and peppers.  Drink enough fluid to keep your urine pale yellow. General instructions  Do not take baths, swim, use a hot tub, or do anything that would put the wound underwater until your health care provider approves. Ask your health care provider if you may take showers. You may only be allowed to take sponge baths.  Do not scratch or pick at the wound. Keep it covered as told by your health care provider.  Return to your normal activities as told by your health care provider. Ask your health care provider what activities are safe for you.  Protect your wound from the sun when you are outside for the first 6 months, or for as long as told by your health care provider. Cover   up the scar area or apply sunscreen that has an SPF of at least 30.  Do not use any products that contain nicotine or tobacco, such as cigarettes, e-cigarettes, and chewing tobacco. These may delay wound healing. If you need help quitting, ask your health care provider.  Keep all follow-up visits as told by your health care provider. This is important. Contact a health care provider if:  You received a tetanus shot and you have swelling, severe pain, redness, or bleeding at the injection site.  Your pain is not controlled  with medicine.  You have any of these signs of infection: ? More redness, swelling, or pain around the wound. ? Fluid or blood coming from the wound. ? Warmth coming from the wound. ? Pus or a bad smell coming from the wound. ? A fever or chills.  You are nauseous or you vomit.  You are dizzy. Get help right away if:  You have a red streak of skin near the area around your wound.  Your wound has been closed with staples, sutures, skin glue, or adhesive strips and it begins to open up and separate.  Your wound is bleeding, and the bleeding does not stop with gentle pressure.  You have a rash.  You faint.  You have trouble breathing. These symptoms may represent a serious problem that is an emergency. Do not wait to see if the symptoms will go away. Get medical help right away. Call your local emergency services (911 in the [place] Do not drive yourself to the hospital. Summary  Always wash your hands with soap and water for at least 20 seconds before and after changing your dressing.  Change your dressing as told by your health care provider.  To help with healing, eat foods that are rich in protein, vitamin A, vitamin C, and other nutrients.  Check your wound every day for signs of infection. Contact your health care provider if you suspect that your wound is infected. This information is not intended to replace advice given to you by your health care provider. Make sure you discuss any questions you have with your health care provider. Document Revised: 12/04/2018 Document Reviewed: 12/04/2018 Elsevier Patient Education  2021 [employer]  

## 2020-06-01 NOTE — Assessment & Plan Note (Addendum)
-  likely benign given rate and progression of growth and appearance. Noted to be symmetrical, circumscribed and uniform color. Shave biopsy performed and sample sent for pathology.  -return precautions and after care

## 2020-06-09 ENCOUNTER — Telehealth: Payer: Self-pay | Admitting: Family Medicine

## 2020-06-09 NOTE — Telephone Encounter (Signed)
Result discussed with the patient - benign melanocytic nevus These changes extend to the base of the biopsy. Hence, may reoccur. He is advised to return for re-excision if there is reoccurrence. He agreed with the plan and was appreciative of the call.

## 2020-09-03 ENCOUNTER — Other Ambulatory Visit: Payer: Self-pay

## 2020-09-03 ENCOUNTER — Encounter (HOSPITAL_COMMUNITY): Payer: Self-pay | Admitting: Emergency Medicine

## 2020-09-03 ENCOUNTER — Ambulatory Visit (HOSPITAL_COMMUNITY)
Admission: EM | Admit: 2020-09-03 | Discharge: 2020-09-03 | Disposition: A | Discharge status: Home / Self Care | Payer: 59 | Attending: Physician Assistant | Admitting: Physician Assistant

## 2020-09-03 DIAGNOSIS — M542 Cervicalgia: Secondary | ICD-10-CM

## 2020-09-03 DIAGNOSIS — M62838 Other muscle spasm: Secondary | ICD-10-CM

## 2020-09-03 MED ORDER — TIZANIDINE HCL 4 MG PO CAPS: 4.0000 mg | ORAL_CAPSULE | Freq: Two times a day (BID) | ORAL | 0 refills | Status: AC | PRN

## 2020-09-03 MED ORDER — KETOROLAC TROMETHAMINE 30 MG/ML IJ SOLN
30.0000 mg | Freq: Once | INTRAMUSCULAR | Status: AC
  Administered 2020-09-03: 30 mg via INTRAMUSCULAR

## 2020-09-03 MED ORDER — KETOROLAC TROMETHAMINE 30 MG/ML IJ SOLN
INTRAMUSCULAR | Status: AC
  Filled 2020-09-03: qty 1

## 2020-09-03 NOTE — ED Triage Notes (Signed)
Pt Is present today with right side neck pain. Pt states that his sx started two weeks ago.

## 2020-09-03 NOTE — ED Provider Notes (Addendum)
[name]    CSN: [id number] Arrival date & time: 09/03/20  1225      History   Chief Complaint Chief Complaint  Patient presents with   Neck Pain    HPI Brandon Carpenter is a 60 y.o. male.   Patient presents today with a 2-week history of intermittent neck pain.  He denies any known injury or increase in activity prior to symptom onset.  He reports pain is rated 8 on a 0-10 pain scale during episodes, localized to right trapezius, described as tenseness or aching, no aggravating or alleviating factors identified.  He denies any weakness, numbness, paresthesias in hands.  He denies any previous spinal surgery or neck injury.  He has been using Aspercreme and Tylenol without improvement of symptoms.  He denies any fever, headache, vision changes, dizziness.   Past Medical History:  Diagnosis Date   Benign localized prostatic hyperplasia with lower urinary tract symptoms (LUTS)    History of 2019 novel coronavirus disease (COVID-19) 12/2019   per pt tested positive 10/ 2021, asymptomatic   History of sepsis 05/01/2020   hospital admission with urosepsis secondary to complicated UTI with e. coli, AKI, bilateral hydronephrosis, left ureteral stone-- resolved   Left ureteral calculus    Pre-diabetes 04/2020    Patient Active Problem List   Diagnosis Date Noted   Nevus 06/01/2020   Ganglion cyst 05/18/2020   Change in multiple nevi 05/08/2020   Liver lesion 05/07/2020   Urinary retention 05/02/2020   Ureteral stone with hydronephrosis    E. coli sepsis ([place]) 05/01/2020   Personal history of COVID-19 03/23/2020    Past Surgical History:  Procedure Laterality Date   CYSTOSCOPY WITH URETEROSCOPY AND STENT PLACEMENT Bilateral 05/01/2020   Procedure: URETERAL STENT PLACEMENT;  Surgeon: Irine Seal, MD;  Location: [facility];  Service: Urology;  Laterality: Bilateral;   CYSTOSCOPY/URETEROSCOPY/HOLMIUM LASER/STENT PLACEMENT Left 05/23/2020   Procedure: CYSTOSCOPY/ LEFT  UETEROSCOPY/HOLMIUM LASER/STENT EXCHANGE;  Surgeon: Irine Seal, MD;  Location: [facility];  Service: Urology;  Laterality: Left;       Home Medications    Prior to Admission medications   Medication Sig Start Date End Date Taking? Authorizing Provider  tiZANidine (ZANAFLEX) 4 MG capsule Take 1 capsule (4 mg total) by mouth 2 (two) times daily as needed for muscle spasms. 09/03/20  Yes Caleesi Kohl, Derry Skill, PA-C  acetaminophen (TYLENOL) 325 MG tablet Take 2 tablets (650 mg total) by mouth every 6 (six) hours as needed for mild pain. 05/03/20   Lilland, Alana, DO  finasteride (PROSCAR) 5 MG tablet Take 1 tablet (5 mg total) by mouth daily. 05/18/20   Simmons-Robinson, Makiera, MD  finasteride (PROSCAR) 5 MG tablet TAKE 1 TABLET (5 MG TOTAL) BY MOUTH DAILY. 05/03/20 05/03/21  Lilland, Alana, DO  tamsulosin (FLOMAX) 0.4 MG CAPS capsule Take 2 capsules (0.8 mg total) by mouth daily. 05/23/20   Irine Seal, MD  tamsulosin (FLOMAX) 0.4 MG CAPS capsule TAKE 1 CAPSULE (0.4 MG TOTAL) BY MOUTH DAILY. 05/03/20 05/03/21  Rise Patience, DO    Family History History reviewed. No pertinent family history.  Social History Social History   Tobacco Use   Smoking status: Never   Smokeless tobacco: Never  Vaping Use   Vaping Use: Never used  Substance Use Topics   Alcohol use: Yes    Comment: occasional   Drug use: Never     Allergies   Penicillins   Review of Systems Review of Systems  Constitutional:  Positive  for activity change. Negative for appetite change, fatigue and fever.  Eyes:  Negative for photophobia and visual disturbance.  Respiratory:  Negative for cough and shortness of breath.   Cardiovascular:  Negative for chest pain.  Gastrointestinal:  Negative for abdominal pain, diarrhea, nausea and vomiting.  Musculoskeletal:  Positive for neck pain. Negative for arthralgias, back pain and myalgias.  Neurological:  Negative for dizziness, light-headedness and headaches.     Physical Exam Triage Vital Signs ED Triage Vitals  Enc Vitals Group     BP 09/03/20 1338 139/81     Pulse Rate 09/03/20 1338 63     Resp 09/03/20 1338 18     Temp 09/03/20 1338 98.2 F (36.8 C)     Temp Source 09/03/20 1338 Oral     SpO2 09/03/20 1338 100 %     Weight --      Height --      Head Circumference --      Peak Flow --      Pain Score 09/03/20 1342 4     Pain Loc --      Pain Edu? --      Excl. in [place]? --    No data found.  Updated Vital Signs BP 139/81   Pulse 63   Temp 98.2 F (36.8 C) (Oral)   Resp 18   SpO2 100%   Visual Acuity Right Eye Distance:   Left Eye Distance:   Bilateral Distance:    Right Eye Near:   Left Eye Near:    Bilateral Near:     Physical Exam Vitals reviewed.  Constitutional:      General: He is awake.     Appearance: Normal appearance. He is normal weight. He is not ill-appearing.     Comments: Very pleasant male appears stated age no acute distress sitting comfortably in exam room  HENT:     Head: Normocephalic and atraumatic.  Cardiovascular:     Rate and Rhythm: Normal rate and regular rhythm.     Heart sounds: Normal heart sounds, S1 normal and S2 normal. No murmur heard. Pulmonary:     Effort: Pulmonary effort is normal.     Breath sounds: Normal breath sounds. No stridor. No wheezing, rhonchi or rales.     Comments: Clear to auscultation bilaterally Musculoskeletal:     Cervical back: Normal range of motion and neck supple. Spasms and tenderness present. No bony tenderness. Muscular tenderness present. No spinous process tenderness.     Thoracic back: No tenderness or bony tenderness.     Lumbar back: No tenderness or bony tenderness.     Comments: Tenderness palpation along right trapezius.  Spasm noted.  No pain percussion of vertebrae.  Normal active range of motion.  No deformity or step-off noted.  Strength 5/5 bilateral upper extremities.  Neurological:     Mental Status: He is alert.  Psychiatric:         Behavior: Behavior is cooperative.     UC Treatments / Results  Labs (all labs ordered are listed, but only abnormal results are displayed) Labs Reviewed - No data to display  EKG   Radiology No results found.  Procedures Procedures (including critical care time)  Medications Ordered in UC Medications  ketorolac (TORADOL) 30 MG/ML injection 30 mg (30 mg Intramuscular Given 09/03/20 1423)    Initial Impression / Assessment and Plan / UC Course  I have reviewed the triage vital signs and the nursing notes.  Pertinent labs &  imaging results that were available during my care of the patient were reviewed by me and considered in my medical decision making (see chart for details).      No x-ray ordered given no traumatic injury and no bony tenderness on exam.  Discussed that symptoms are likely muscular in nature given clinical presentation.  Patient was given Toradol injection and instructed to hold additional NSAIDs for 24 hours.  He can use Tylenol for breakthrough pain.  He was prescribed Zanaflex with instruction to take this twice daily as needed.  He was instructed not to drive or drink alcohol with this medication as drowsiness is a common side effect.  Encouraged him to use conservative treatment measures to help manage symptoms.  Discussed alarm symptoms that warrant emergent evaluation.  Strict return precautions given to which patient expressed understanding.  Final Clinical Impressions(s) / UC Diagnoses   Final diagnoses:  Neck pain  Trapezius muscle spasm     Discharge Instructions      We gave you Toradol today.  Please do not take any NSAIDs such as aspirin/ibuprofen/naproxen for 24 hours.  You can use Tylenol for pain relief.   Use Zanaflex (tizanidine) up to twice a day as needed for muscle pain.  This will make you sleepy so do not drive or drink alcohol with it.  Use heat, rest, stretch for additional symptom relief.  Follow-up with either our clinic or PCP  within 1 week.  If you have any worsening symptoms including increased pain, fever, headache, numbness, tingling sensation in your arms you need to go to the emergency room.     ED Prescriptions     Medication Sig Dispense Auth. Provider   tiZANidine (ZANAFLEX) 4 MG capsule Take 1 capsule (4 mg total) by mouth 2 (two) times daily as needed for muscle spasms. 20 capsule Brandon Carpenter, Derry Skill, PA-C      PDMP not reviewed this encounter.   Terrilee Croak, PA-C 09/03/20 1413    RaspetDerry Skill, PA-C 09/03/20 1834

## 2020-09-03 NOTE — Discharge Instructions (Addendum)
We gave you Toradol today.  Please do not take any NSAIDs such as aspirin/ibuprofen/naproxen for 24 hours.  You can use Tylenol for pain relief.   Use Zanaflex (tizanidine) up to twice a day as needed for muscle pain.  This will make you sleepy so do not drive or drink alcohol with it.  Use heat, rest, stretch for additional symptom relief.  Follow-up with either our clinic or PCP within 1 week.  If you have any worsening symptoms including increased pain, fever, headache, numbness, tingling sensation in your arms you need to go to the emergency room.

## 2023-01-14 IMAGING — CT CT ANGIO CHEST
2 of 7 series · 18 of 46 positions shown · IV contrast (APPLIED)
Comparison: Chest x-ray from earlier in the same day.

CLINICAL DATA: Shortness of breath and nonproductive cough

EXAM:
CT ANGIOGRAPHY CHEST WITH CONTRAST
TECHNIQUE: Multidetector CT imaging of the chest was performed using the
standard protocol during bolus administration of intravenous
contrast. Multiplanar CT image reconstructions and MIPs were
obtained to evaluate the vascular anatomy.
CONTRAST:  98mL OMNIPAQUE IOHEXOL 350 MG/ML SOLN

[Series 7: thins · axial · 0.74mm/px · z∈[-206,+68]mm · 15 of 441 slices shown]
[im 25/441  lung]
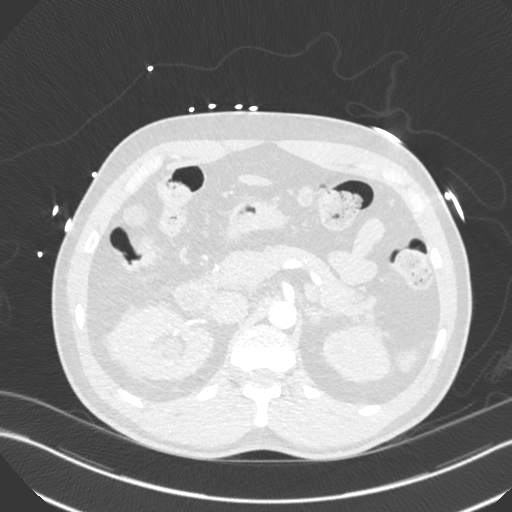
[im 49/441  soft-tissue]
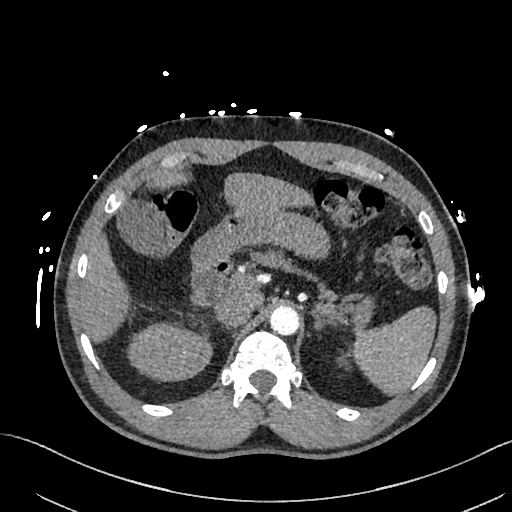
[im 74/441  lung]
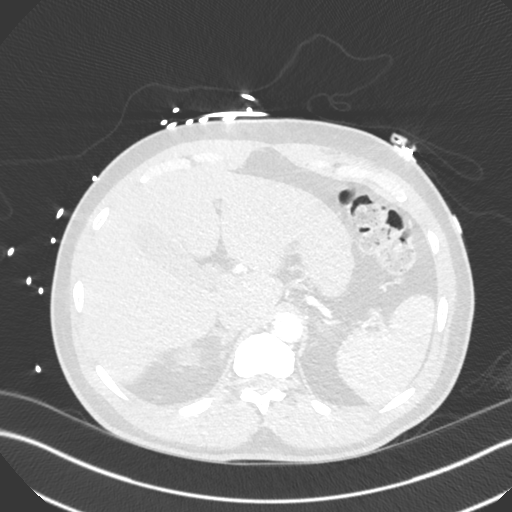
[im 98/441  soft-tissue]
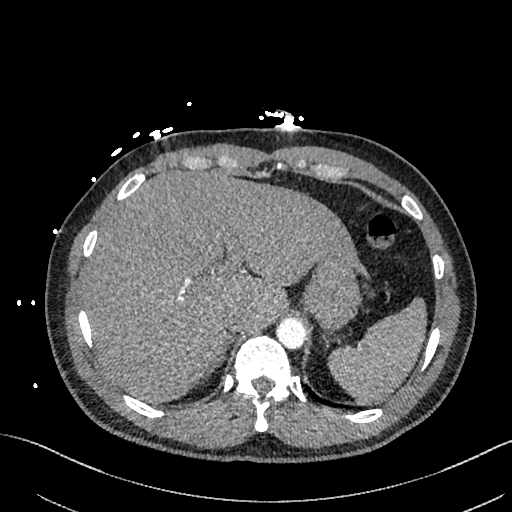
[im 147/441  lung]
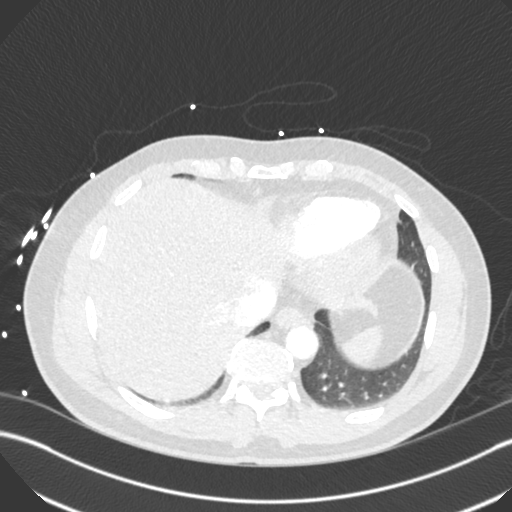
[im 172/441  soft-tissue]
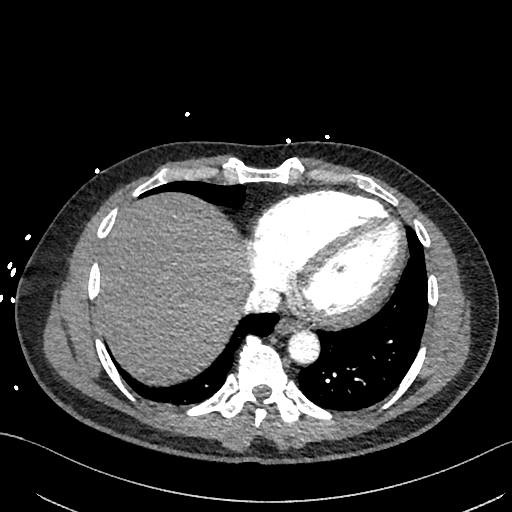
[im 196/441  lung]
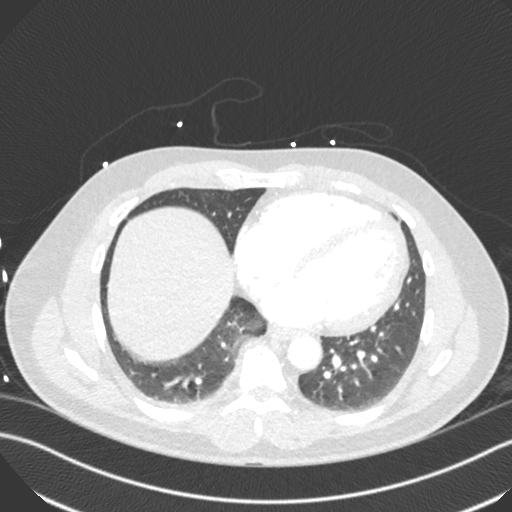
[im 221/441  soft-tissue]
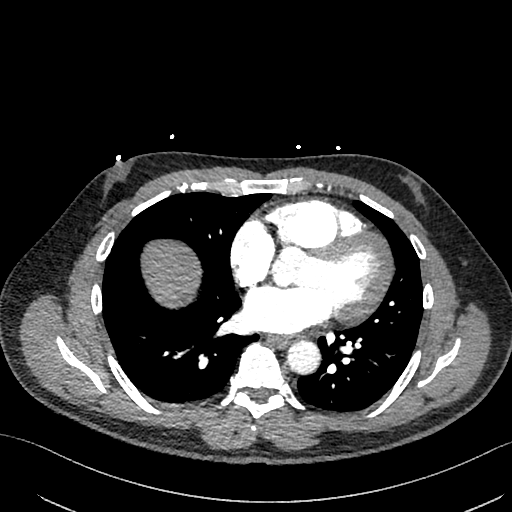
[im 245/441  lung]
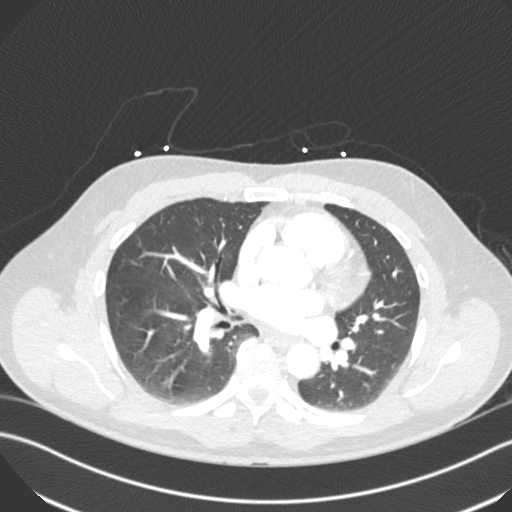
[im 269/441  soft-tissue]
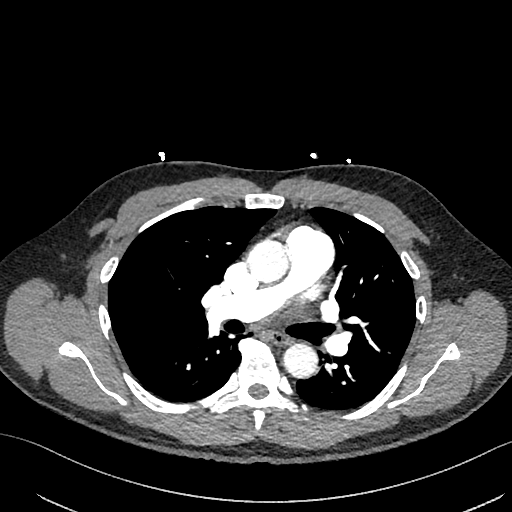
[im 294/441  lung]
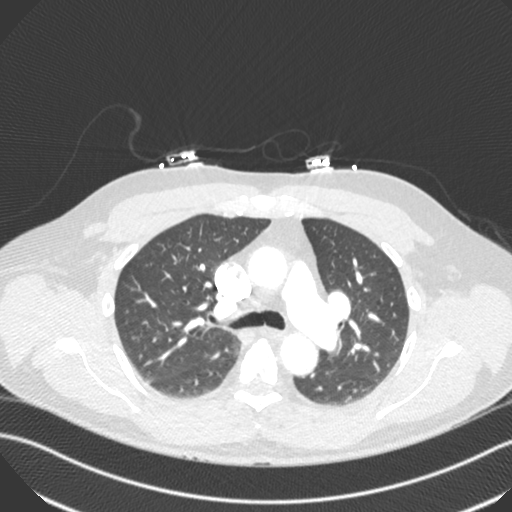
[im 343/441  soft-tissue]
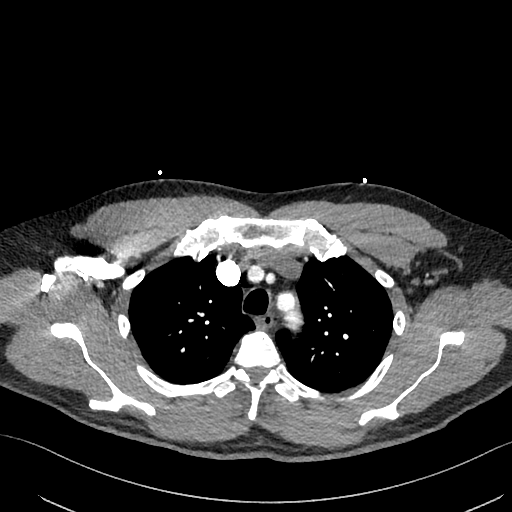
[im 367/441  lung]
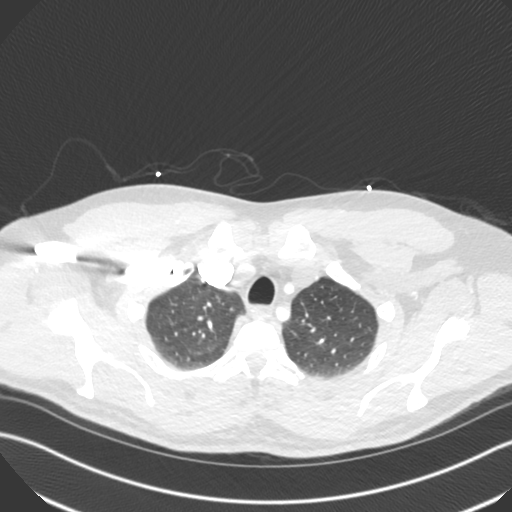
[im 392/441  soft-tissue]
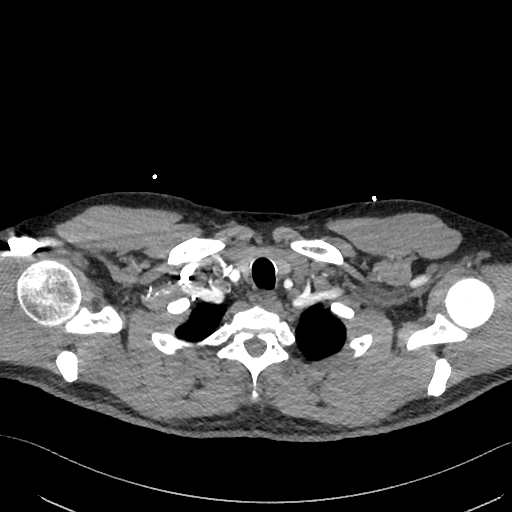
[im 416/441  lung]
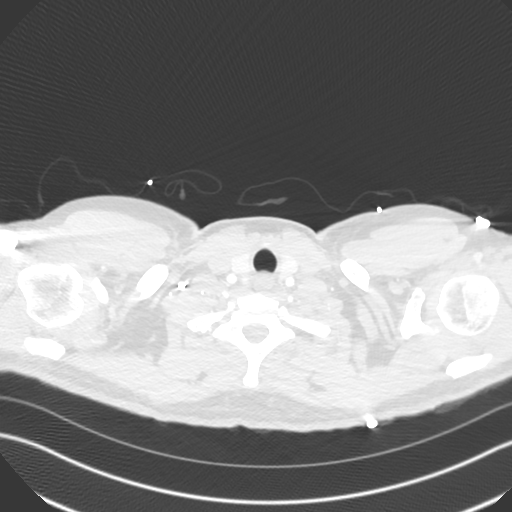

[Series 8: cor · coronal · 0.68mm/px · 3 of 133 slices shown]
[im 34/133  soft-tissue]
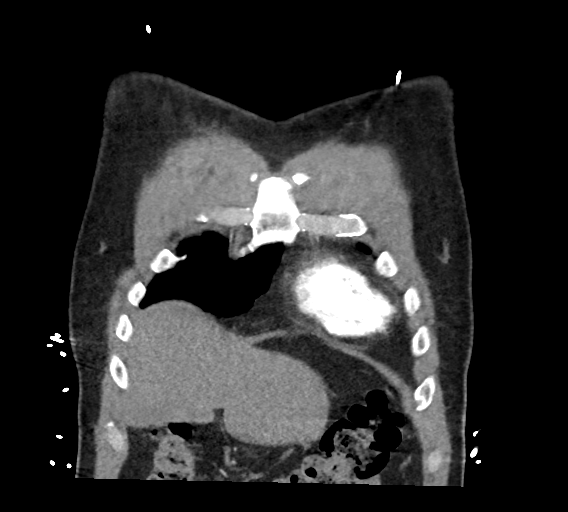
[im 67/133  soft-tissue]
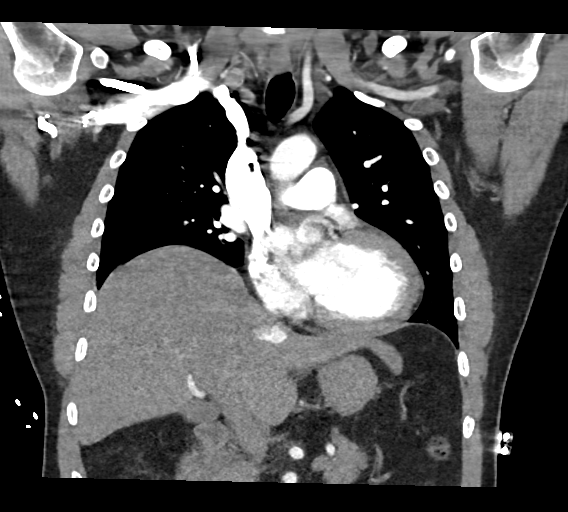
[im 100/133  soft-tissue]
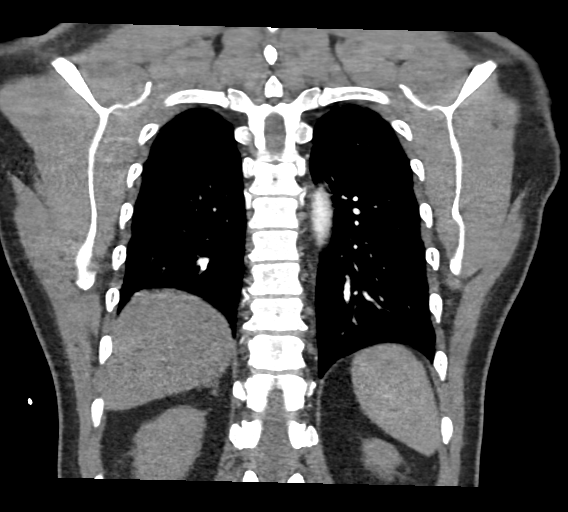

[18 of 46 positions shown; findings below may reference images not displayed]

FINDINGS: Cardiovascular: Thoracic aorta and its branches are within normal
limits. No significant coronary calcifications are noted. No cardiac
enlargement is seen. No pericardial effusion is seen. The pulmonary
artery is well visualized within normal branching pattern
bilaterally. No filling defects to suggest pulmonary emboli are
identified.

Mediastinum/Nodes: Thoracic inlet is within normal limits. No
sizable hilar or mediastinal adenopathy is noted. Esophagus as
visualized within normal limits.

Lungs/Pleura: Minimal right basilar atelectasis is noted. No focal
infiltrate or sizable effusion is seen. No sizable parenchymal
nodule is noted.

Upper Abdomen: Visualized upper abdomen demonstrates a hypodensity
within the left lobe of the liver best seen image number 309 of
series 7 likely representing a cyst but incompletely evaluated on
this exam. It is better visualized on this exam than on the prior
precontrast images.

Musculoskeletal: Mild degenerative changes of the thoracic spine are
noted.

Review of the MIP images confirms the above findings.
IMPRESSION: No evidence of pulmonary emboli.

Mild right basilar atelectasis.

Hypodense lesion in the left lobe of the liver better visualized
than on the prior precontrast image likely representing a cyst but
incompletely evaluated on this exam. Nonemergent ultrasound could be
performed for clarification as clinically necessary.

## 2023-01-22 IMAGING — US US ABDOMEN LIMITED RUQ/ASCITES
1 series · 14 of 25 positions shown · non-contrast
Comparison: 05/01/2020

CLINICAL DATA: Liver lesion

EXAM:
ULTRASOUND ABDOMEN LIMITED RIGHT UPPER QUADRANT

[Series 1: us abdomen limited ruq/ascites · 0.28mm/px · 14 of 69 slices shown]
[im 1/69]
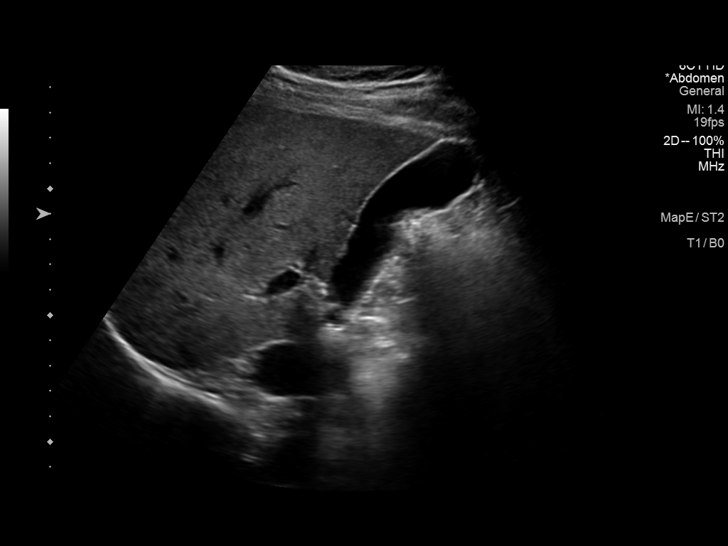
[im 6/69]
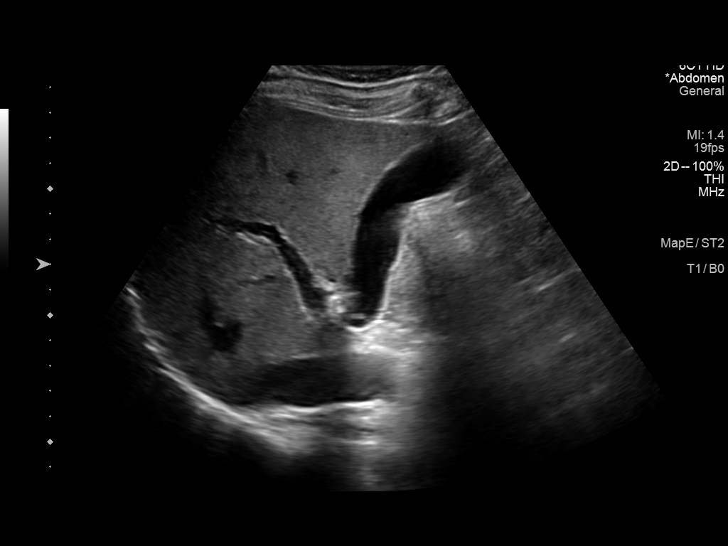
[im 12/69]
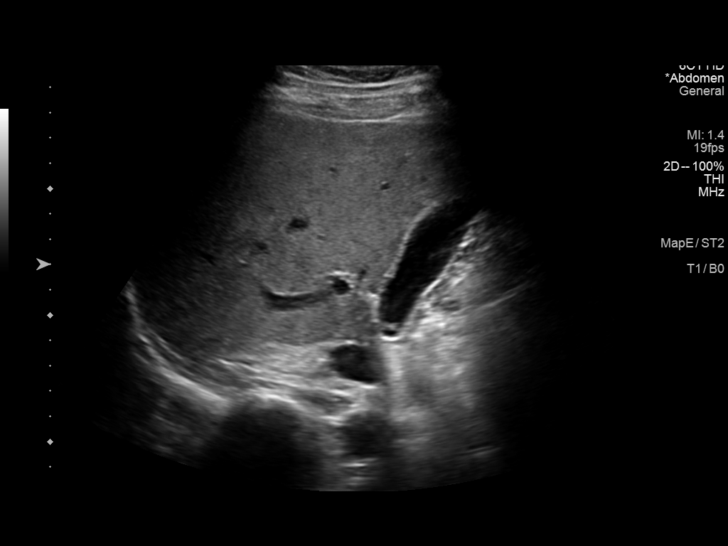
[im 18/69]
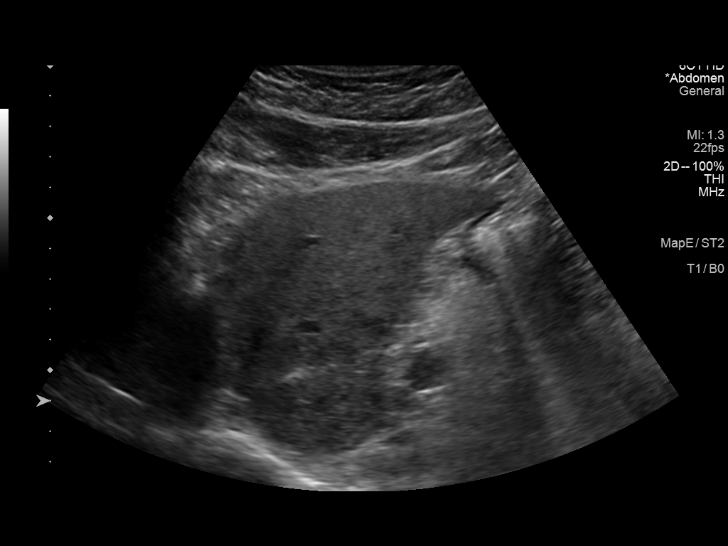
[im 23/69]
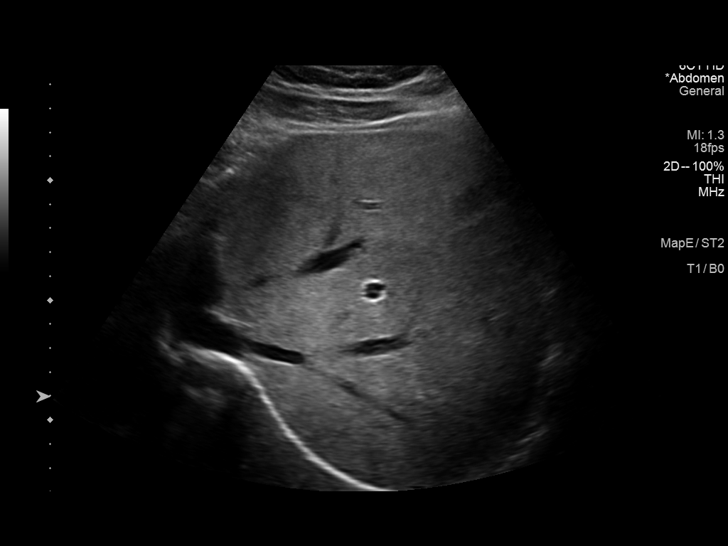
[im 26/69]
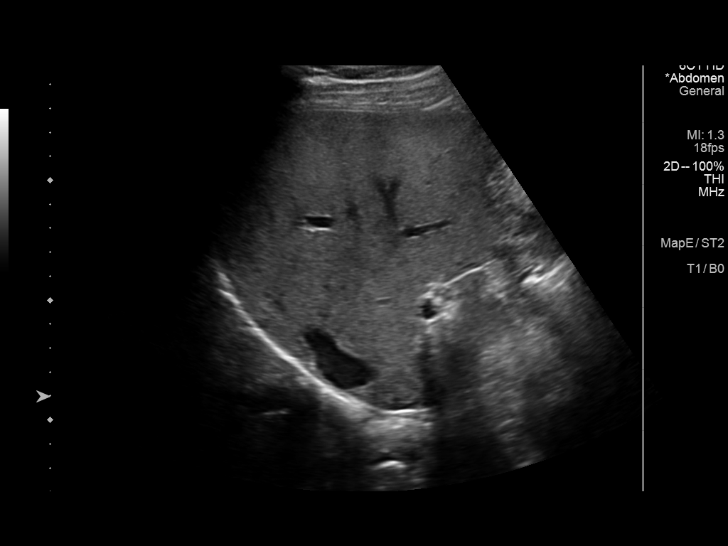
[im 32/69]
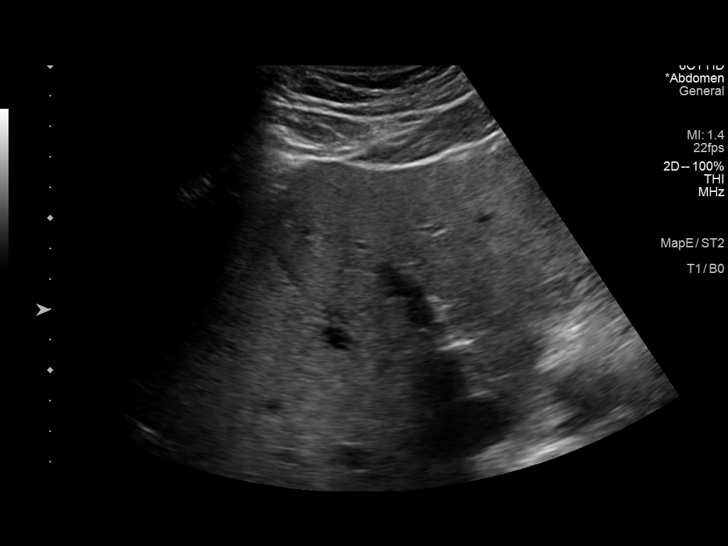
[im 37/69]
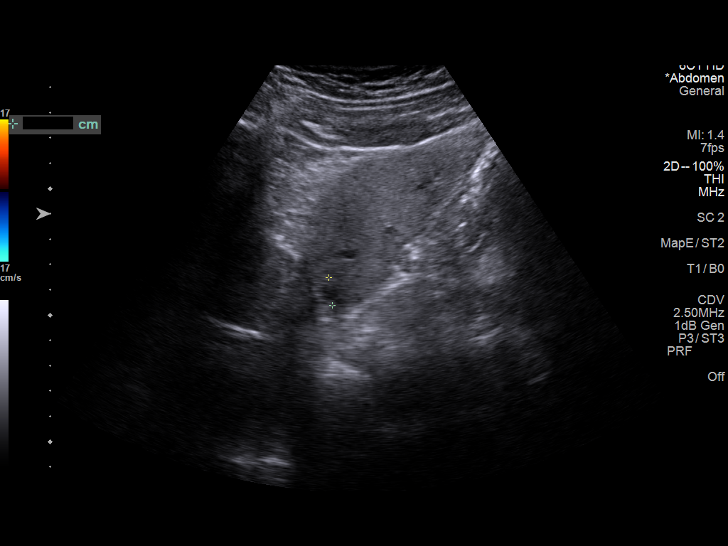
[im 43/69]
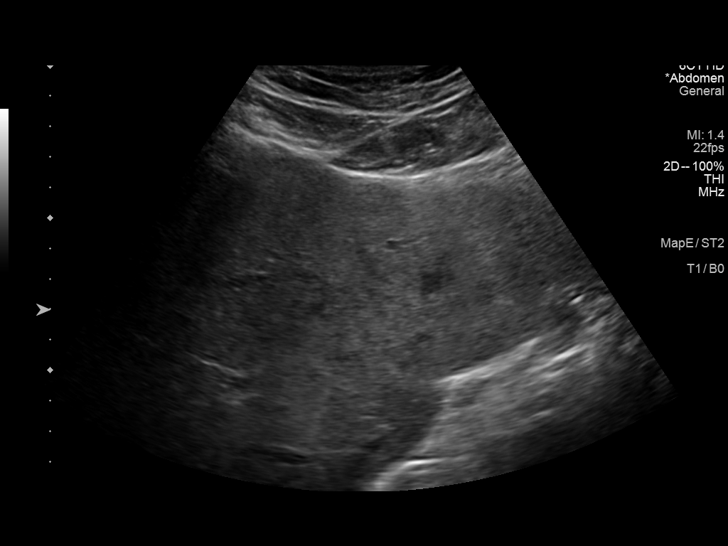
[im 46/69]
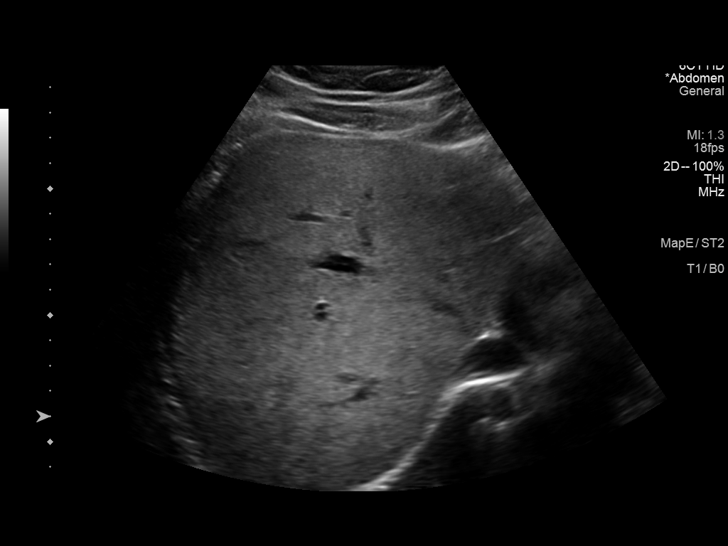
[im 52/69]
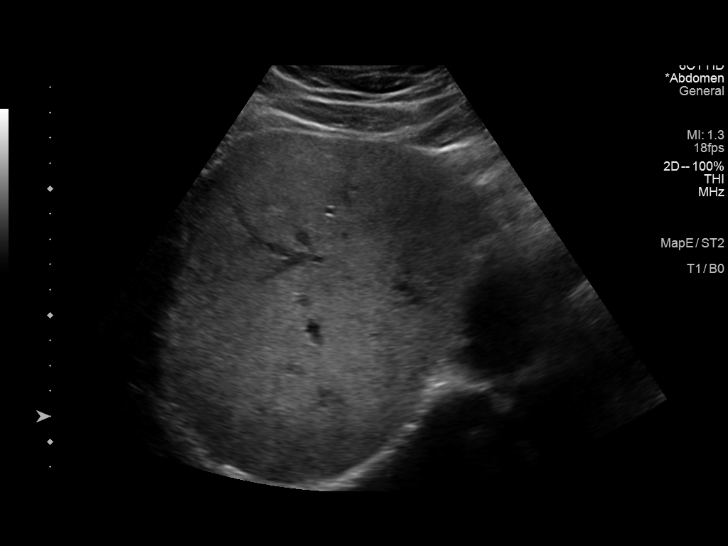
[im 57/69]
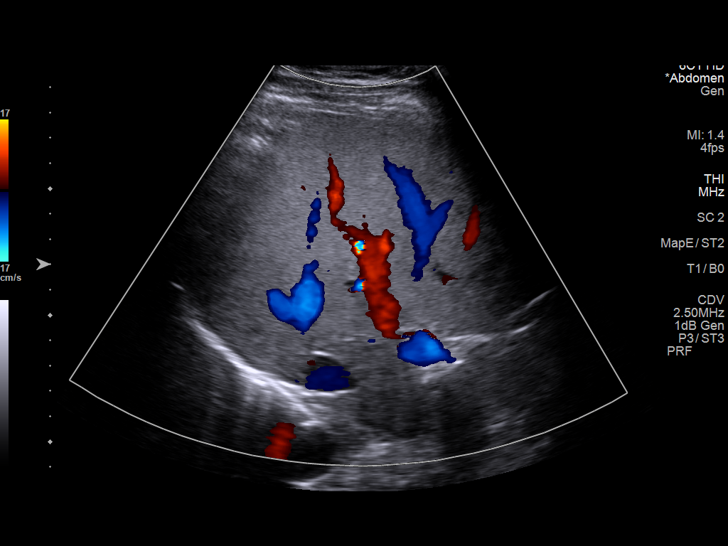
[im 63/69]
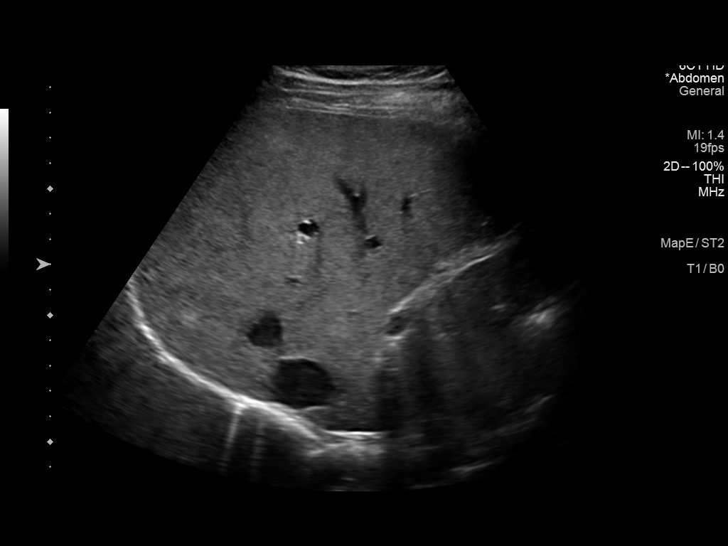
[im 69/69]
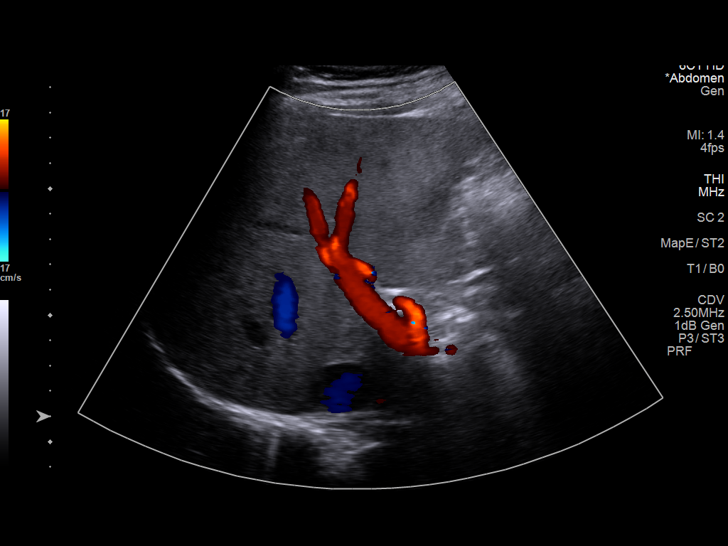

[14 of 25 positions shown; findings below may reference images not displayed]

FINDINGS: Gallbladder:

No gallstones or wall thickening visualized. No sonographic Murphy
sign noted by sonographer.

Common bile duct:

Diameter: Normal caliber, 4 mm

Liver:

Slightly increased echotexture diffusely suggests early fatty
infiltration. 1.5 cm cystic lesion noted in the left hepatic lobe
corresponds to the low-density lesion seen on prior CT. Small
echogenic area in the inferior right hepatic lobe measures up to
cm, not seen on prior noncontrast CT. This likely reflects small
hemangioma. Portal vein is patent on color Doppler imaging with
normal direction of blood flow towards the liver.

Other: None.
IMPRESSION: Left hepatic lesion seen on CT reflects a 1.5 cm benign-appearing
cyst.

1.3 cm echogenic area in the right hepatic lobe most compatible with
hemangioma.

Slight increased echotexture suggests early fatty infiltration.
